# Patient Record
Sex: Male | Born: 1970 | Race: White | Hispanic: No | Marital: Single | Smoking: Current every day smoker
Health system: Southern US, Community
[De-identification: ages and names within clinical notes are randomized; demographics above are authoritative.]

## PROBLEM LIST (undated history)

## (undated) DIAGNOSIS — M549 Dorsalgia, unspecified: Secondary | ICD-10-CM

## (undated) DIAGNOSIS — J302 Other seasonal allergic rhinitis: Secondary | ICD-10-CM

## (undated) DIAGNOSIS — K4090 Unilateral inguinal hernia, without obstruction or gangrene, not specified as recurrent: Secondary | ICD-10-CM

---

## 2006-04-18 HISTORY — PX: HERNIA REPAIR: SHX51

## 2006-10-11 ENCOUNTER — Observation Stay (HOSPITAL_COMMUNITY): Admission: RE | Admit: 2006-10-11 | Discharge: 2006-10-11 | Payer: Self-pay | Admitting: General Surgery

## 2010-08-31 NOTE — Op Note (Signed)
NAME:  Dale Benjamin, Dale Benjamin NO.:  [id number]   MEDICAL RECORD NO.:  [id number]          PATIENT TYPE:  AMB   LOCATION:  DAY                           FACILITY:  [facility]   PHYSICIAN:  Barbaraann Barthel, M.D. DATE OF BIRTH:  1971-01-24   DATE OF PROCEDURE:  10/11/2006  DATE OF DISCHARGE:                               OPERATIVE REPORT   SURGEON:  Barbaraann Barthel, MD   PREOPERATIVE DIAGNOSIS:  Right inguinal hernia.   POSTOPERATIVE DIAGNOSIS:  Right inguinal hernia.   SPECIMEN:  None.   GROSS OPERATIVE FINDINGS:  Those consistent with a direct inguinal  hernia defect.   PROCEDURE:  Right inguinal herniorrhaphy (modified McVay repair).   NOTE:  This is a 40 year old white male who had a 3 or 50-month history  of a right groin mass.  He was referred by Dr. Wende Crease for a right  inguinal hernia.  We scheduled him for outpatient surgery.  We discussed  preoperatively complications not limited to but including bleeding,  infection and recurrence.  Informed consent was obtained.   TECHNIQUE:  The patient was placed in a supine position.  After the  adequate administration of general anesthesia via LMA anesthesia, his  abdomen and groin area were shaved and prepped with Betadine solution  and draped in the usual manner.  An incision was carried out between the  anterior superior iliac spine and the pubic tubercle through skin,  subcutaneous tissue and the Scarpa layer down to the external oblique.  The patient had very scanty amount of subcutaneous tissue.  The external  oblique was opened through the external ring.  The cord structures were  dissected free.  He had no indirect defect.  He had a direct defect.  This was repaired by suturing transversalis and transversus abdominis to  Cooper's ligament and Poupart's ligament with interrupted 2-0 Bralon  sutures.  A relaxing incision was carried out prior to cinching these  down tightly.  We then used approximately 10 mL of 0.5%  Sensorcaine to  help with postoperative comfort.  We returned the cord structures to  their anatomical position after irrigating with normal saline solution  and approximated the external oblique over the cord structures.  The  subcu was irrigated.  The skin  was approximated with stapling device.  A sterile OpSite dressing was  applied.  Prior to closure all sponge, needle and instrument counts were  found to be correct.  Estimated blood loss was minimal.  The patient  received 600 mL of crystalloids intraoperatively.  There were no  complications.      Barbaraann Barthel, M.D.  Electronically Signed     WB/MEDQ  D:  10/11/2006  T:  10/11/2006  Job:  [id number]   cc:   Laverle Hobby, M.D.  [address]  [place], [place] [address]

## 2011-02-02 LAB — CBC
MCHC: 33.5
MCV: 89.4
RBC: 4.97
RDW: 13.9

## 2011-02-02 LAB — DIFFERENTIAL
Basophils Absolute: 0
Basophils Relative: 0
Blasts: 0
Myelocytes: 0
Neutro Abs: 4.1
Neutrophils Relative %: 66
Promyelocytes Absolute: 0

## 2011-02-02 LAB — BASIC METABOLIC PANEL
BUN: 11
CO2: 26
Calcium: 9
Chloride: 105
Creatinine, Ser: 0.95
Glucose, Bld: 92

## 2015-08-25 ENCOUNTER — Encounter (HOSPITAL_COMMUNITY): Payer: Self-pay | Admitting: *Deleted

## 2015-08-25 ENCOUNTER — Emergency Department (HOSPITAL_COMMUNITY)
Admission: EM | Admit: 2015-08-25 | Discharge: 2015-08-25 | Disposition: A | Discharge status: Home / Self Care | Payer: Self-pay | Attending: Emergency Medicine | Admitting: Emergency Medicine

## 2015-08-25 DIAGNOSIS — Y929 Unspecified place or not applicable: Secondary | ICD-10-CM | POA: Insufficient documentation

## 2015-08-25 DIAGNOSIS — Y939 Activity, unspecified: Secondary | ICD-10-CM | POA: Insufficient documentation

## 2015-08-25 DIAGNOSIS — S0502XA Injury of conjunctiva and corneal abrasion without foreign body, left eye, initial encounter: Secondary | ICD-10-CM | POA: Insufficient documentation

## 2015-08-25 DIAGNOSIS — Y999 Unspecified external cause status: Secondary | ICD-10-CM | POA: Insufficient documentation

## 2015-08-25 DIAGNOSIS — F17210 Nicotine dependence, cigarettes, uncomplicated: Secondary | ICD-10-CM | POA: Insufficient documentation

## 2015-08-25 DIAGNOSIS — X58XXXA Exposure to other specified factors, initial encounter: Secondary | ICD-10-CM | POA: Insufficient documentation

## 2015-08-25 MED ORDER — KETOROLAC TROMETHAMINE 0.5 % OP SOLN
1.0000 [drp] | Freq: Once | OPHTHALMIC | Status: AC
  Administered 2015-08-25: 1 [drp] via OPHTHALMIC
  Filled 2015-08-25: qty 5

## 2015-08-25 MED ORDER — TETRACAINE HCL 0.5 % OP SOLN
2.0000 [drp] | Freq: Once | OPHTHALMIC | Status: DC
  Filled 2015-08-25: qty 4

## 2015-08-25 MED ORDER — FLUORESCEIN SODIUM 1 MG OP STRP: 1.0000 | ORAL_STRIP | Freq: Once | OPHTHALMIC | Status: DC

## 2015-08-25 MED ORDER — TOBRAMYCIN 0.3 % OP SOLN
1.0000 [drp] | Freq: Once | OPHTHALMIC | Status: AC
  Administered 2015-08-25: 1 [drp] via OPHTHALMIC
  Filled 2015-08-25: qty 5

## 2015-08-25 NOTE — Discharge Instructions (Signed)
Corneal Abrasion °The cornea is the clear covering at the front and center of the eye. When you look at the colored portion of the eye, you are looking through the cornea. It is a thin tissue made up of layers. The top layer is the most sensitive layer. A corneal abrasion happens if this layer is scratched or an injury causes it to come off.  °HOME CARE °· You may be given drops or a medicated cream. Use the medicine as told by your doctor. °· A pressure patch may be put over the eye. If this is done, follow your doctor's instructions for when to remove the patch. Do not drive or use machines while the eye patch is on. Judging distances is hard to do with a patch on. °· See your doctor for a follow-up exam if you are told to do so. It is very important that you keep this appointment. °GET HELP IF:  °· You have pain, are sensitive to light, and have a scratchy feeling in one eye or both eyes. °· Your pressure patch keeps getting loose. You can blink your eye under the patch. °· You have fluid coming from your eye or the lids stick together in the morning. °· You have the same symptoms in the morning that you did with the first abrasion. This could be days, weeks, or months after the first abrasion healed. °  °This information is not intended to replace advice given to you by your health care provider. Make sure you discuss any questions you have with your health care provider. °  °Document Released: 09/21/2007 Document Revised: 12/24/2014 Document Reviewed: 12/10/2012 °Elsevier Interactive Patient Education ©2016 [employer] ° °

## 2015-08-25 NOTE — ED Notes (Addendum)
Pt states he woke up with left eye drainage and redness. Pt left eye is notably red. Pt denies any injury. Denies n/v/d.

## 2015-08-27 NOTE — ED Provider Notes (Signed)
CSN: [id number]     Arrival date & time 08/25/15  1159 History   First MD Initiated Contact with Patient 08/25/15 1216     Chief Complaint  Patient presents with  . Eye Problem     (Consider location/radiation/quality/duration/timing/severity/associated sxs/prior Treatment) HPI   Dale Benjamin is a 45 y.o. male who presents to the Emergency Department complaining of left eye pain and redness.  Symptoms began on the morning of arrival.  He reports pain associated with blinking.  He also states he has excessive tearing, photophobia, and foreign body sensation.  He denies known trauma, but does admit to weed eating on the evening prior to arrival.  He denies dizziness, headache, facial pain or weakness and neck pain.  Also denies contact use.   History reviewed. No pertinent past medical history. Past Surgical History  Procedure Laterality Date  . Hernia repair     No family history on file. Social History  Substance Use Topics  . Smoking status: Current Every Day Smoker -- 0.50 packs/day    Types: Cigarettes  . Smokeless tobacco: None  . Alcohol Use: None     Comment: occ.    Review of Systems  Constitutional: Negative for fever and chills.  HENT: Negative for congestion.   Eyes: Positive for photophobia, pain, redness and visual disturbance.  Respiratory: Negative for cough and shortness of breath.   Gastrointestinal: Negative for nausea and vomiting.  Neurological: Negative for dizziness, syncope, weakness and headaches.  All other systems reviewed and are negative.     Allergies  Review of patient's allergies indicates no known allergies.  Home Medications   Prior to Admission medications   Not on File   BP 111/64 mmHg  Pulse 86  Temp(Src) 98.2 F (36.8 C) (Oral)  Resp 16  Ht 5\' 8"  (1.727 m)  Wt 58.968 kg  BMI 19.77 kg/m2  SpO2 100% Physical Exam  Constitutional: He is oriented to person, place, and time. He appears well-developed and well-nourished. No  distress.  HENT:  Head: Normocephalic and atraumatic.  Eyes: EOM are normal. Pupils are equal, round, and reactive to light. Lids are everted and swept, no foreign bodies found. Right conjunctiva is not injected. Left conjunctiva is injected.  Fundoscopic exam:      The left eye shows no papilledema.  Slit lamp exam:      The left eye shows corneal abrasion. The left eye shows no corneal flare, no corneal ulcer, no foreign body, no hyphema and no fluorescein uptake.  Three corneal abrasions to the left eye.  Slit lamp exam reveals negative Seidel's sign, no hyphema or FB.  Neck: Normal range of motion. Neck supple. No thyromegaly present.  Cardiovascular: Normal rate, regular rhythm and intact distal pulses.   Pulmonary/Chest: Effort normal and breath sounds normal. No respiratory distress.  Musculoskeletal: Normal range of motion.  Lymphadenopathy:    He has no cervical adenopathy.  Neurological: He is alert and oriented to person, place, and time. He exhibits normal muscle tone. Coordination normal.  Skin: Skin is warm and dry. No rash noted.  Nursing note and vitals reviewed.   ED Course  Procedures (including critical care time) Labs Review Labs Reviewed - No data to display  Imaging Review No results found. I have personally reviewed and evaluated these images and lab results as part of my medical decision-making.   EKG Interpretation None        Visual Acuity  Right Eye Distance: 20/70 Left Eye Distance:  (  unable to assess; patient states too blurry) Bilateral Distance:  (Patient states he wears glasses and does not have them with him.)    MDM   Final diagnoses:  Corneal abrasion, left, initial encounter    Pt with three corneal abrasions to the left eye.  No FB's seen.    Saline flush of left eye.  Pt stable for d/c and he agrees to close ophtho f/u.  Referral info given.  Tobramycin and ketorolac drops dispensed.      Pauline Aus, PA-C 08/27/15  2226  Donnetta Hutching, MD 09/14/15 1038

## 2016-02-15 ENCOUNTER — Emergency Department (HOSPITAL_COMMUNITY): Payer: Self-pay

## 2016-02-15 ENCOUNTER — Encounter (HOSPITAL_COMMUNITY): Payer: Self-pay | Admitting: Emergency Medicine

## 2016-02-15 ENCOUNTER — Emergency Department (HOSPITAL_COMMUNITY)
Admission: EM | Admit: 2016-02-15 | Discharge: 2016-02-15 | Disposition: A | Discharge status: Home / Self Care | Payer: Self-pay | Attending: Emergency Medicine | Admitting: Emergency Medicine

## 2016-02-15 DIAGNOSIS — F17210 Nicotine dependence, cigarettes, uncomplicated: Secondary | ICD-10-CM | POA: Insufficient documentation

## 2016-02-15 DIAGNOSIS — K4090 Unilateral inguinal hernia, without obstruction or gangrene, not specified as recurrent: Secondary | ICD-10-CM | POA: Insufficient documentation

## 2016-02-15 LAB — COMPREHENSIVE METABOLIC PANEL
ALT: 17 U/L (ref 17–63)
AST: 20 U/L (ref 15–41)
Albumin: 5 g/dL (ref 3.5–5.0)
Alkaline Phosphatase: 72 U/L (ref 38–126)
Anion gap: 8 (ref 5–15)
BUN: 10 mg/dL (ref 6–20)
CHLORIDE: 102 mmol/L (ref 101–111)
CO2: 27 mmol/L (ref 22–32)
CREATININE: 0.87 mg/dL (ref 0.61–1.24)
Calcium: 9.8 mg/dL (ref 8.9–10.3)
GFR calc non Af Amer: >60 mL/min (ref >60)
Glucose, Bld: 111 mg/dL — ABNORMAL HIGH (ref 65–99)
POTASSIUM: 3.6 mmol/L (ref 3.5–5.1)
SODIUM: 137 mmol/L (ref 135–145)
Total Bilirubin: 0.5 mg/dL (ref 0.3–1.2)
Total Protein: 8.1 g/dL (ref 6.5–8.1)

## 2016-02-15 LAB — CBC WITH DIFFERENTIAL/PLATELET
BASOS ABS: 0 10*3/uL (ref 0.0–0.1)
Basophils Relative: 0 %
EOS ABS: 0 10*3/uL (ref 0.0–0.7)
EOS PCT: 0 %
HCT: 48.8 % (ref 39.0–52.0)
Hemoglobin: 16.3 g/dL (ref 13.0–17.0)
LYMPHS ABS: 0.6 10*3/uL — AB (ref 0.7–4.0)
Lymphocytes Relative: 6 %
MCH: 31.7 pg (ref 26.0–34.0)
MCHC: 33.4 g/dL (ref 30.0–36.0)
MCV: 94.9 fL (ref 78.0–100.0)
Monocytes Absolute: 0.3 10*3/uL (ref 0.1–1.0)
Monocytes Relative: 3 %
Neutro Abs: 8.8 10*3/uL — ABNORMAL HIGH (ref 1.7–7.7)
Neutrophils Relative %: 91 %
PLATELETS: 223 10*3/uL (ref 150–400)
RBC: 5.14 MIL/uL (ref 4.22–5.81)
RDW: 13.1 % (ref 11.5–15.5)
WBC: 9.7 10*3/uL (ref 4.0–10.5)

## 2016-02-15 MED ORDER — IOPAMIDOL (ISOVUE-300) INJECTION 61%
100.0000 mL | Freq: Once | INTRAVENOUS | Status: AC | PRN
  Administered 2016-02-15: 100 mL via INTRAVENOUS

## 2016-02-15 MED ORDER — IOPAMIDOL (ISOVUE-300) INJECTION 61%
INTRAVENOUS | Status: AC
  Filled 2016-02-15: qty 30

## 2016-02-15 MED ORDER — HYDROCODONE-ACETAMINOPHEN 5-325 MG PO TABS: 1.0000 | ORAL_TABLET | Freq: Four times a day (QID) | ORAL | 0 refills | Status: DC | PRN

## 2016-02-15 MED ORDER — LORAZEPAM 2 MG/ML IJ SOLN
0.5000 mg | Freq: Once | INTRAMUSCULAR | Status: AC
  Administered 2016-02-15: 0.5 mg via INTRAVENOUS
  Filled 2016-02-15: qty 1

## 2016-02-15 MED ORDER — HYDROMORPHONE HCL 1 MG/ML IJ SOLN
1.0000 mg | Freq: Once | INTRAMUSCULAR | Status: AC
Start: 2016-02-15 — End: 2016-02-15
  Administered 2016-02-15: 1 mg via INTRAVENOUS
  Filled 2016-02-15 (×2): qty 1

## 2016-02-15 MED ORDER — ONDANSETRON HCL 4 MG/2ML IJ SOLN
4.0000 mg | Freq: Once | INTRAMUSCULAR | Status: AC
  Administered 2016-02-15: 4 mg via INTRAVENOUS
  Filled 2016-02-15: qty 2

## 2016-02-15 NOTE — Discharge Instructions (Signed)
Follow-up with Dr. Lovell SheehanJenkins and 1 week. Return if problems prior

## 2016-02-15 NOTE — ED Provider Notes (Signed)
[facility]-EMERGENCY DEPT Provider Note   CSN: [id number] Arrival date & time: 02/15/16  1220  By signing my name below, I, Clovis PuAvnee Patel, attest that this documentation has been prepared under the direction and in the presence of Bethann BerkshireJoseph Arhaan Chesnut, MD  Electronically Signed: Clovis PuAvnee Patel, ED Scribe. 02/15/16. 1:18 PM.   History   Chief Complaint Chief Complaint  Patient presents with  . Groin Pain    Patient has a history of a right inguinal hernia repair which has become more painful   The history is provided by the patient. No language interpreter was used.  Groin Pain  This is a recurrent problem. The current episode started 6 to 12 hours ago. The problem occurs constantly. The problem has been gradually worsening. Associated symptoms include abdominal pain. Pertinent negatives include no chest pain and no headaches. He has tried nothing for the symptoms.   HPI Comments:  Dale Benjamin is a 45 y.o. male, with a PSHx of hernia repair, who presents to the Emergency Department complaining of constant, cramping, sharp groin pain which began today. Pt notes he has had gradually worsening, intermittent groin pain for about 6 months. He states he has had the hernia for about 8 months-1 year. No alleviating factors noted. Pt denies any other symptoms or complaints at this time.  History reviewed. No pertinent past medical history.  There are no active problems to display for this patient.   Past Surgical History:  Procedure Laterality Date  . HERNIA REPAIR      Home Medications    Prior to Admission medications   Not on File    Family History History reviewed. No pertinent family history.  Social History Social History  Substance Use Topics  . Smoking status: Current Every Day Smoker    Packs/day: 0.50    Types: Cigarettes  . Smokeless tobacco: Never Used  . Alcohol use Yes     Comment: occ.     Allergies   Review of patient's allergies indicates no known  allergies.   Review of Systems Review of Systems  Constitutional: Negative for appetite change and fatigue.  HENT: Negative for congestion, ear discharge and sinus pressure.   Eyes: Negative for discharge.  Respiratory: Negative for cough.   Cardiovascular: Negative for chest pain.  Gastrointestinal: Positive for abdominal pain. Negative for diarrhea.  Genitourinary: Negative for frequency and hematuria.  Musculoskeletal: Negative for back pain.  Skin: Negative for rash.  Neurological: Negative for seizures and headaches.  Psychiatric/Behavioral: Negative for hallucinations.   Physical Exam Updated Vital Signs BP 141/86 (BP Location: Left Arm)   Pulse 74   Temp 97.7 F (36.5 C) (Oral)   Resp 20   Ht 5\' 8"  (1.727 m)   Wt 130 lb (59 kg)   SpO2 100%   BMI 19.77 kg/m   Physical Exam  Constitutional: He is oriented to person, place, and time. He appears well-developed.  HENT:  Head: Normocephalic.  Eyes: Conjunctivae and EOM are normal. No scleral icterus.  Neck: Neck supple. No thyromegaly present.  Cardiovascular: Normal rate and regular rhythm.  Exam reveals no gallop and no friction rub.   No murmur heard. Pulmonary/Chest: No stridor. He has no wheezes. He has no rales. He exhibits no tenderness.  Abdominal: He exhibits no distension. There is tenderness. There is no rebound. A hernia is present.  R inguinal hernia. 3 cm in diameter. Not reducible.   Musculoskeletal: Normal range of motion. He exhibits no edema.  Lymphadenopathy:  He has no cervical adenopathy.  Neurological: He is oriented to person, place, and time. He exhibits normal muscle tone. Coordination normal.  Skin: No rash noted. No erythema.  Psychiatric: He has a normal mood and affect. His behavior is normal.  Nursing note and vitals reviewed.  ED Treatments / Results  DIAGNOSTIC STUDIES:  Oxygen Saturation is 100% on RA, normal by my interpretation.    COORDINATION OF CARE:  1:10 PM Discussed  treatment plan with pt at bedside and pt agreed to plan.  Labs (all labs ordered are listed, but only abnormal results are displayed) Labs Reviewed  CBC WITH DIFFERENTIAL/PLATELET  BASIC METABOLIC PANEL  URINALYSIS, ROUTINE W REFLEX MICROSCOPIC (NOT [facility])    EKG  EKG Interpretation None       Radiology No results found.  Procedures Procedures (including critical care time)  Medications Ordered in ED Medications - No data to display   Initial Impression / Assessment and Plan / ED Course  I have reviewed the triage vital signs and the nursing notes.  Pertinent labs & imaging results that were available during my care of the patient were reviewed by me and considered in my medical decision making (see chart for details).  Clinical Course    CT scan shows right inguinal hernia with fat and fluid. No bowel and hernia. Patient will follow-up with general surgery  Final Clinical Impressions(s) / ED Diagnoses   Final diagnoses:  None    New Prescriptions New Prescriptions   No medications on file  The chart was scribed for me under my direct supervision.  I personally performed the history, physical, and medical decision making and all procedures in the evaluation of this patient.Bethann Berkshire.     Oma Alpert, MD 02/15/16 [phone]

## 2016-02-15 NOTE — ED Triage Notes (Signed)
Patient complaining of right groin pain radiating into umbilical area. States he had hernia surgery in area years ago.

## 2016-02-15 NOTE — ED Notes (Signed)
Pt taken to xray 

## 2016-04-13 ENCOUNTER — Encounter (HOSPITAL_COMMUNITY): Payer: Self-pay | Admitting: Emergency Medicine

## 2016-04-13 ENCOUNTER — Emergency Department (HOSPITAL_COMMUNITY): Payer: Self-pay

## 2016-04-13 ENCOUNTER — Inpatient Hospital Stay (HOSPITAL_COMMUNITY)
Admission: EM | Admit: 2016-04-13 | Discharge: 2016-04-14 | DRG: 395 | Disposition: A | Discharge status: Home / Self Care | Payer: Self-pay | Attending: General Surgery | Admitting: General Surgery

## 2016-04-13 DIAGNOSIS — K4090 Unilateral inguinal hernia, without obstruction or gangrene, not specified as recurrent: Secondary | ICD-10-CM

## 2016-04-13 DIAGNOSIS — J302 Other seasonal allergic rhinitis: Secondary | ICD-10-CM | POA: Diagnosis present

## 2016-04-13 DIAGNOSIS — K4031 Unilateral inguinal hernia, with obstruction, without gangrene, recurrent: Principal | ICD-10-CM | POA: Diagnosis present

## 2016-04-13 DIAGNOSIS — K56609 Unspecified intestinal obstruction, unspecified as to partial versus complete obstruction: Secondary | ICD-10-CM

## 2016-04-13 DIAGNOSIS — F17210 Nicotine dependence, cigarettes, uncomplicated: Secondary | ICD-10-CM | POA: Diagnosis present

## 2016-04-13 DIAGNOSIS — R112 Nausea with vomiting, unspecified: Secondary | ICD-10-CM

## 2016-04-13 HISTORY — DX: Other seasonal allergic rhinitis: J30.2

## 2016-04-13 LAB — COMPREHENSIVE METABOLIC PANEL
ALBUMIN: 4.7 g/dL (ref 3.5–5.0)
ALK PHOS: 67 U/L (ref 38–126)
ALT: 33 U/L (ref 17–63)
ANION GAP: 8 (ref 5–15)
AST: 38 U/L (ref 15–41)
BUN: 23 mg/dL — ABNORMAL HIGH (ref 6–20)
CALCIUM: 9.8 mg/dL (ref 8.9–10.3)
CHLORIDE: 97 mmol/L — AB (ref 101–111)
CO2: 31 mmol/L (ref 22–32)
Creatinine, Ser: 0.92 mg/dL (ref 0.61–1.24)
GFR calc non Af Amer: >60 mL/min (ref >60)
GLUCOSE: 116 mg/dL — AB (ref 65–99)
POTASSIUM: 4.6 mmol/L (ref 3.5–5.1)
SODIUM: 136 mmol/L (ref 135–145)
Total Bilirubin: 0.9 mg/dL (ref 0.3–1.2)
Total Protein: 7.9 g/dL (ref 6.5–8.1)

## 2016-04-13 LAB — CBC
HEMATOCRIT: 50.9 % (ref 39.0–52.0)
HEMOGLOBIN: 17 g/dL (ref 13.0–17.0)
MCH: 32.9 pg (ref 26.0–34.0)
MCHC: 33.4 g/dL (ref 30.0–36.0)
MCV: 98.5 fL (ref 78.0–100.0)
Platelets: 241 10*3/uL (ref 150–400)
RBC: 5.17 MIL/uL (ref 4.22–5.81)
RDW: 14.2 % (ref 11.5–15.5)
WBC: 14 10*3/uL — ABNORMAL HIGH (ref 4.0–10.5)

## 2016-04-13 LAB — LIPASE, BLOOD: LIPASE: 21 U/L (ref 11–51)

## 2016-04-13 LAB — URINALYSIS, ROUTINE W REFLEX MICROSCOPIC
Bilirubin Urine: NEGATIVE
Glucose, UA: NEGATIVE mg/dL
HGB URINE DIPSTICK: NEGATIVE
Ketones, ur: 15 mg/dL — AB
Leukocytes, UA: NEGATIVE
Nitrite: NEGATIVE
PH: 6 (ref 5.0–8.0)
Protein, ur: NEGATIVE mg/dL
SPECIFIC GRAVITY, URINE: 1.02 (ref 1.005–1.030)

## 2016-04-13 LAB — LACTIC ACID, PLASMA: Lactic Acid, Venous: 1.1 mmol/L (ref 0.5–1.9)

## 2016-04-13 LAB — MAGNESIUM: MAGNESIUM: 2.5 mg/dL — AB (ref 1.7–2.4)

## 2016-04-13 MED ORDER — ONDANSETRON HCL 4 MG/2ML IJ SOLN
4.0000 mg | INTRAMUSCULAR | Status: DC | PRN
  Administered 2016-04-13: 4 mg via INTRAVENOUS
  Filled 2016-04-13: qty 2

## 2016-04-13 MED ORDER — MORPHINE SULFATE (PF) 4 MG/ML IV SOLN
4.0000 mg | INTRAVENOUS | Status: AC | PRN
  Administered 2016-04-13 (×2): 4 mg via INTRAVENOUS
  Filled 2016-04-13 (×2): qty 1

## 2016-04-13 MED ORDER — IOPAMIDOL (ISOVUE-300) INJECTION 61%
100.0000 mL | Freq: Once | INTRAVENOUS | Status: AC | PRN
  Administered 2016-04-13: 100 mL via INTRAVENOUS

## 2016-04-13 MED ORDER — ONDANSETRON HCL 4 MG/2ML IJ SOLN
4.0000 mg | INTRAMUSCULAR | Status: DC | PRN
  Filled 2016-04-13: qty 2

## 2016-04-13 MED ORDER — SODIUM CHLORIDE 0.9 % IV SOLN
INTRAVENOUS | Status: DC
  Administered 2016-04-13: 23:00:00 via INTRAVENOUS

## 2016-04-13 MED ORDER — PROMETHAZINE HCL 25 MG/ML IJ SOLN: 12.5000 mg | Freq: Four times a day (QID) | INTRAMUSCULAR | Status: DC | PRN

## 2016-04-13 MED ORDER — MORPHINE SULFATE (PF) 4 MG/ML IV SOLN: 4.0000 mg | INTRAVENOUS | Status: DC | PRN

## 2016-04-13 MED ORDER — FAMOTIDINE IN NACL 20-0.9 MG/50ML-% IV SOLN
20.0000 mg | Freq: Once | INTRAVENOUS | Status: DC
  Filled 2016-04-13: qty 50

## 2016-04-13 MED ORDER — IOPAMIDOL (ISOVUE-300) INJECTION 61%
INTRAVENOUS | Status: AC
  Administered 2016-04-13: 30 mL
  Filled 2016-04-13: qty 30

## 2016-04-13 NOTE — H&P (Signed)
History and Physical    Dale Benjamin ZOX:[id number] DOB: April 08, 1971 DOA: 04/13/2016  PCP: No PCP Per Patient   Patient coming from: Home.  Chief Complaint: Abdominal pain.  HPI: Dale Benjamin is a 45 y.o. male with medical history significant of seasonal allergies and inguinal right hernia repair about 18 years ago this coming with complaints of generalized abdominal pain, nausea and emesis multiple times for the past 2 days. He denies fever, chills, chest pain, dyspnea, diarrhea, constipation melena, hematochezia or GU symptoms. He mentions that his hernia "has been sticking out" for the past year.  ED Course: The patient received famotidine, normal saline, morphine 4, promethazine 12.5 and Zofran 4 mg IVP which she states produced relief. His workup showed WBC of 14, hemoglobin 17 g/dL, platelets 811. His sodium was 136, potassium 4.6, chloride 97 and CO2 31 mmol/L. BUN 23, creatinine 0.92 and glucose 160 mg/dL.  Imaging: CT scan of abdomen/pelvis showed small bowel obstruction secondary to right inguinal hernia. Mesenteric edema and free fluid, but no perforation.  Review of Systems: As per HPI otherwise 10 point review of systems negative.    Past Medical History:  Diagnosis Date  . Seasonal allergies     Past Surgical History:  Procedure Laterality Date  . HERNIA REPAIR Right 2008     reports that he has been smoking Cigarettes.  He has been smoking about 0.50 packs per day. He has never used smokeless tobacco. He reports that he drinks alcohol. He reports that he does not use drugs.  No Known Allergies  Family History  Problem Relation Age of Onset  . Allergies Mother     Prior to Admission medications   Medication Sig Start Date End Date Taking? Authorizing Provider  HYDROcodone-acetaminophen (NORCO/VICODIN) 5-325 MG tablet Take 1 tablet by mouth every 6 (six) hours as needed. 02/15/16   Bethann Berkshire, MD    Physical Exam:  Constitutional: NAD, calm,  comfortable Vitals:   04/13/16 1750 04/13/16 1751 04/13/16 2038 04/13/16 2309  BP: 132/90  123/89 131/74  Pulse: 65  87 75  Resp: 16  16 17   Temp: 99.4 F (37.4 C)     TempSrc: Oral     SpO2: 99%  100% 100%  Weight:  59 kg (130 lb)    Height:  5\' 8"  (1.727 m)     Eyes: PERRL, lids and conjunctivae normal ENMT: Mucous membranes are mildly dry. Posterior pharynx clear of any exudate or lesions. Neck: normal, supple, no masses, no thyromegaly Respiratory: clear to auscultation bilaterally, no wheezing, no crackles. Normal respiratory effort. No accessory muscle use.  Cardiovascular: Regular rate and rhythm, no murmurs / rubs / gallops. No extremity edema. 2+ pedal pulses. No carotid bruits.  Abdomen: Soft, mild diffuse tenderness, no guarding/rebound/masses palpated. No hepatosplenomegaly. Bowel sounds positive. Visible, non-reducible right inguinal hernia. Musculoskeletal: no clubbing / cyanosis. No joint deformity upper and lower extremities. Good ROM, no contractures. Normal muscle tone.  Skin: No rashes, lesions, ulcers. Neurologic: CN 2-12 grossly intact. Sensation intact, DTR normal. Strength 5/5 in all 4.  Psychiatric: Normal judgment and insight. Alert and oriented x 3. Normal mood.    Labs on Admission: I have personally reviewed following labs and imaging studies  CBC:  Recent Labs Lab 04/13/16 1908  WBC 14.0*  HGB 17.0  HCT 50.9  MCV 98.5  PLT 241   Basic Metabolic Panel:  Recent Labs Lab 04/13/16 1908 04/13/16 2247  NA 136  --   K 4.6  --  CL 97*  --   CO2 31  --   GLUCOSE 116*  --   BUN 23*  --   CREATININE 0.92  --   CALCIUM 9.8  --   MG  --  2.5*   GFR: Estimated Creatinine Clearance: 86.4 mL/min (by C-G formula based on SCr of 0.92 mg/dL). Liver Function Tests:  Recent Labs Lab 04/13/16 1908  AST 38  ALT 33  ALKPHOS 67  BILITOT 0.9  PROT 7.9  ALBUMIN 4.7    Recent Labs Lab 04/13/16 1908  LIPASE 21   No results for input(s):  AMMONIA in the last 168 hours. Coagulation Profile: No results for input(s): INR, PROTIME in the last 168 hours. Cardiac Enzymes: No results for input(s): CKTOTAL, CKMB, CKMBINDEX, TROPONINI in the last 168 hours. BNP (last 3 results) No results for input(s): PROBNP in the last 8760 hours. HbA1C: No results for input(s): HGBA1C in the last 72 hours. CBG: No results for input(s): GLUCAP in the last 168 hours. Lipid Profile: No results for input(s): CHOL, HDL, LDLCALC, TRIG, CHOLHDL, LDLDIRECT in the last 72 hours. Thyroid Function Tests: No results for input(s): TSH, T4TOTAL, FREET4, T3FREE, THYROIDAB in the last 72 hours. Anemia Panel: No results for input(s): VITAMINB12, FOLATE, FERRITIN, TIBC, IRON, RETICCTPCT in the last 72 hours. Urine analysis:    Component Value Date/Time   COLORURINE YELLOW 04/13/2016 1754   APPEARANCEUR CLEAR 04/13/2016 1754   LABSPEC 1.020 04/13/2016 1754   PHURINE 6.0 04/13/2016 1754   GLUCOSEU NEGATIVE 04/13/2016 1754   HGBUR NEGATIVE 04/13/2016 1754   BILIRUBINUR NEGATIVE 04/13/2016 1754   KETONESUR 15 (A) 04/13/2016 1754   PROTEINUR NEGATIVE 04/13/2016 1754   NITRITE NEGATIVE 04/13/2016 1754   LEUKOCYTESUR NEGATIVE 04/13/2016 1754    Radiological Exams on Admission: Ct Abdomen Pelvis W Contrast  Result Date: 04/13/2016 CLINICAL DATA:  Mid and lower abdominal pain for 2 days. Vomiting. Nausea. EXAM: CT ABDOMEN AND PELVIS WITH CONTRAST TECHNIQUE: Multidetector CT imaging of the abdomen and pelvis was performed using the standard protocol following bolus administration of intravenous contrast. CONTRAST:  100mL ISOVUE-300 IOPAMIDOL (ISOVUE-300) INJECTION 61% COMPARISON:  CT 02/15/2016 FINDINGS: Lower chest: Allowing for motion artifact, the lung bases are clear. Hepatobiliary: No focal liver abnormality is seen. No gallstones, gallbladder wall thickening, or biliary dilatation. Pancreas: Unremarkable. No pancreatic ductal dilatation or surrounding  inflammatory changes. Spleen: Normal in size without focal abnormality. Adrenals/Urinary Tract: Normal adrenal glands. No hydronephrosis or perinephric edema. Extrarenal pelvis configuration on the right. Small cyst in the lower left kidney is unchanged. Urinary bladder is physiologically distended without wall thickening. Stomach/Bowel: Stomach distended with ingested contrast. Diffuse fluid-filled small bowel dilatation. Small bowel dilated to transition point within a right inguinal hernia. The exiting and distal most small bowel loops are decompressed. There is mesenteric edema and free fluid but no pneumatosis. No perforation. The colon is relatively decompressed. Portions of the appendix tentatively identified without inflammation. Vascular/Lymphatic: No significant vascular findings are present. No enlarged abdominal or pelvic lymph nodes. Reproductive: Prostate is unremarkable. Other: Mesenteric edema and small volume of free fluid. No evidence of free air. There is fat and trace fluid in the left inguinal canal. Musculoskeletal: There are no acute or suspicious osseous abnormalities. IMPRESSION: Small bowel obstruction secondary to right inguinal hernia. Mesenteric edema and free fluid but no evidence of perforation. Electronically Signed   By: Rubye OaksMelanie  Ehinger M.D.   On: 04/13/2016 22:03    EKG: Independently reviewed. Pending.  Assessment/Plan Principal Problem:  SBO (small bowel obstruction) Admit to MedSurg/inpatient. Continue IV hydration. Continue antiemetics as needed. Continue analgesics as needed. Famotidine 20 mg IVP 1 dose.  Active Problems:   Right inguinal hernia General surgery to evaluate.    Seasonal allergies Asymptomatic now.   DVT prophylaxis: Lovenox SQ. Code Status: Full code. Family Communication:  Disposition Plan: Admit for IV hydration, symptoms treatment and bowel rest. Consults called: Franky MachoMark Jenkins, M.D. (Gen. surgery). Admission status:  Inpatient/MedSurg.   Bobette Moavid Manuel Ortiz MD [facility] Pager [phone].  If 7PM-7AM, please contact night-coverage [email or web address] Password [email or web address]  04/13/2016, 11:38 PM

## 2016-04-13 NOTE — ED Provider Notes (Signed)
[facility]Y DEPT Provider Note   CSN: [id number] Arrival date & time: 04/13/16  1731     History   Chief Complaint Chief Complaint  Patient presents with  . Abdominal Pain    HPI Dale Benjamin is a 45 y.o. male.  HPI  Pt was seen at 1950.   Per pt, c/o gradual onset and persistence of constant generalized abd "pain" for the past 2 days.  Has been associated with multiple intermittent episodes of N/V.  Describes the abd pain as "aching" and located in his periumbilical area.  Denies diarrhea, no fevers, no back pain, no rash, no CP/SOB, no black or blood in stools or emesis.      History reviewed. No pertinent past medical history.  There are no active problems to display for this patient.   Past Surgical History:  Procedure Laterality Date  . HERNIA REPAIR Right 2008       Home Medications    Prior to Admission medications   Medication Sig Start Date End Date Taking? Authorizing Provider  HYDROcodone-acetaminophen (NORCO/VICODIN) 5-325 MG tablet Take 1 tablet by mouth every 6 (six) hours as needed. 02/15/16   Bethann BerkshireJoseph Zammit, MD    Family History No family history on file.  Social History Social History  Substance Use Topics  . Smoking status: Current Every Day Smoker    Packs/day: 0.50    Types: Cigarettes  . Smokeless tobacco: Never Used  . Alcohol use Yes     Comment: occ.     Allergies   Patient has no known allergies.   Review of Systems Review of Systems ROS: Statement: All systems negative except as marked or noted in the HPI; Constitutional: Negative for fever and chills. ; ; Eyes: Negative for eye pain, redness and discharge. ; ; ENMT: Negative for ear pain, hoarseness, nasal congestion, sinus pressure and sore throat. ; ; Cardiovascular: Negative for chest pain, palpitations, diaphoresis, dyspnea and peripheral edema. ; ; Respiratory: Negative for cough, wheezing and stridor. ; ; Gastrointestinal: +N/V, abd pain. Negative for diarrhea,  blood in stool, hematemesis, jaundice and rectal bleeding. . ; ; Genitourinary: Negative for dysuria, flank pain and hematuria. ; ; Genital:  No penile drainage or rash, no testicular pain or swelling, no scrotal rash or swelling. ;; Musculoskeletal: Negative for back pain and neck pain. Negative for swelling and trauma.; ; Skin: Negative for pruritus, rash, abrasions, blisters, bruising and skin lesion.; ; Neuro: Negative for headache, lightheadedness and neck stiffness. Negative for weakness, altered level of consciousness, altered mental status, extremity weakness, paresthesias, involuntary movement, seizure and syncope.       Physical Exam Updated Vital Signs BP 123/89 (BP Location: Right Arm)   Pulse 87   Temp 99.4 F (37.4 C) (Oral)   Resp 16   Ht 5\' 8"  (1.727 m)   Wt 130 lb (59 kg)   SpO2 100%   BMI 19.77 kg/m   Physical Exam 1955: Physical examination:  Nursing notes reviewed; Vital signs and O2 SAT reviewed;  Constitutional: Well developed, Well nourished, Well hydrated, In no acute distress; Head:  Normocephalic, atraumatic; Eyes: EOMI, PERRL, No scleral icterus; ENMT: Mouth and pharynx normal, Mucous membranes moist; Neck: Supple, Full range of motion, No lymphadenopathy; Cardiovascular: Regular rate and rhythm, No gallop; Respiratory: Breath sounds clear & equal bilaterally, No wheezes.  Speaking full sentences with ease, Normal respiratory effort/excursion; Chest: Nontender, Movement normal; Abdomen: Soft, +diffuse tenderness to palp. Nondistended, Normal bowel sounds; Genitourinary: No CVA tenderness; Extremities: Pulses  normal, No tenderness, No edema, No calf edema or asymmetry.; Neuro: AA&Ox3, Major CN grossly intact.  Speech clear. No gross focal motor or sensory deficits in extremities.; Skin: Color normal, Warm, Dry.   ED Treatments / Results  Labs (all labs ordered are listed, but only abnormal results are displayed)   EKG  EKG Interpretation None        Radiology   Procedures Procedures (including critical care time)  Medications Ordered in ED Medications  ondansetron (ZOFRAN) injection 4 mg (4 mg Intravenous Given 04/13/16 2012)  morphine 4 MG/ML injection 4 mg (4 mg Intravenous Given 04/13/16 2011)  iopamidol (ISOVUE-300) 61 % injection (30 mLs  Contrast Given 04/13/16 2125)  iopamidol (ISOVUE-300) 61 % injection 100 mL (100 mLs Intravenous Contrast Given 04/13/16 2126)     Initial Impression / Assessment and Plan / ED Course  I have reviewed the triage vital signs and the nursing notes.  Pertinent labs & imaging results that were available during my care of the patient were reviewed by me and considered in my medical decision making (see chart for details).  MDM Reviewed: previous chart, nursing note and vitals Reviewed previous: labs Interpretation: labs and CT scan    Results for orders placed or performed during the hospital encounter of 04/13/16  Lipase, blood  Result Value Ref Range   Lipase 21 11 - 51 U/L  Comprehensive metabolic panel  Result Value Ref Range   Sodium 136 135 - 145 mmol/L   Potassium 4.6 3.5 - 5.1 mmol/L   Chloride 97 (L) 101 - 111 mmol/L   CO2 31 22 - 32 mmol/L   Glucose, Bld 116 (H) 65 - 99 mg/dL   BUN 23 (H) 6 - 20 mg/dL   Creatinine, Ser 1.61 0.61 - 1.24 mg/dL   Calcium 9.8 8.9 - 09.6 mg/dL   Total Protein 7.9 6.5 - 8.1 g/dL   Albumin 4.7 3.5 - 5.0 g/dL   AST 38 15 - 41 U/L   ALT 33 17 - 63 U/L   Alkaline Phosphatase 67 38 - 126 U/L   Total Bilirubin 0.9 0.3 - 1.2 mg/dL   GFR calc non Af Amer >60 >60 mL/min   GFR calc Af Amer >60 >60 mL/min   Anion gap 8 5 - 15  CBC  Result Value Ref Range   WBC 14.0 (H) 4.0 - 10.5 K/uL   RBC 5.17 4.22 - 5.81 MIL/uL   Hemoglobin 17.0 13.0 - 17.0 g/dL   HCT 04.5 40.9 - 81.1 %   MCV 98.5 78.0 - 100.0 fL   MCH 32.9 26.0 - 34.0 pg   MCHC 33.4 30.0 - 36.0 g/dL   RDW 91.4 78.2 - 95.6 %   Platelets 241 150 - 400 K/uL  Urinalysis, Routine w  reflex microscopic  Result Value Ref Range   Color, Urine YELLOW YELLOW   APPearance CLEAR CLEAR   Specific Gravity, Urine 1.020 1.005 - 1.030   pH 6.0 5.0 - 8.0   Glucose, UA NEGATIVE NEGATIVE mg/dL   Hgb urine dipstick NEGATIVE NEGATIVE   Bilirubin Urine NEGATIVE NEGATIVE   Ketones, ur 15 (A) NEGATIVE mg/dL   Protein, ur NEGATIVE NEGATIVE mg/dL   Nitrite NEGATIVE NEGATIVE   Leukocytes, UA NEGATIVE NEGATIVE   Ct Abdomen Pelvis W Contrast Result Date: 04/13/2016 CLINICAL DATA:  Mid and lower abdominal pain for 2 days. Vomiting. Nausea. EXAM: CT ABDOMEN AND PELVIS WITH CONTRAST TECHNIQUE: Multidetector CT imaging of the abdomen and pelvis was performed using  the standard protocol following bolus administration of intravenous contrast. CONTRAST:  100mL ISOVUE-300 IOPAMIDOL (ISOVUE-300) INJECTION 61% COMPARISON:  CT 02/15/2016 FINDINGS: Lower chest: Allowing for motion artifact, the lung bases are clear. Hepatobiliary: No focal liver abnormality is seen. No gallstones, gallbladder wall thickening, or biliary dilatation. Pancreas: Unremarkable. No pancreatic ductal dilatation or surrounding inflammatory changes. Spleen: Normal in size without focal abnormality. Adrenals/Urinary Tract: Normal adrenal glands. No hydronephrosis or perinephric edema. Extrarenal pelvis configuration on the right. Small cyst in the lower left kidney is unchanged. Urinary bladder is physiologically distended without wall thickening. Stomach/Bowel: Stomach distended with ingested contrast. Diffuse fluid-filled small bowel dilatation. Small bowel dilated to transition point within a right inguinal hernia. The exiting and distal most small bowel loops are decompressed. There is mesenteric edema and free fluid but no pneumatosis. No perforation. The colon is relatively decompressed. Portions of the appendix tentatively identified without inflammation. Vascular/Lymphatic: No significant vascular findings are present. No enlarged  abdominal or pelvic lymph nodes. Reproductive: Prostate is unremarkable. Other: Mesenteric edema and small volume of free fluid. No evidence of free air. There is fat and trace fluid in the left inguinal canal. Musculoskeletal: There are no acute or suspicious osseous abnormalities. IMPRESSION: Small bowel obstruction secondary to right inguinal hernia. Mesenteric edema and free fluid but no evidence of perforation. Electronically Signed   By: Rubye OaksMelanie  Ehinger M.D.   On: 04/13/2016 22:03    2215:  Pt re-evaluated: Pt continues to c/o periumbilical abd pain, nausea. +Right inguinal hernia just medial to previous hernia repair surgical scar, no overlying erythema or ecchymosis. Unable to reduce. Pt c/o periumbilical abd pain during attempted reduction. Pt states "that's been sticking out like that for the past year." Denies any change in the hernia area over the past 2 days. Denies specific hernia pain over the past 2 days. Pt also endorses several months ago he "tried once to put it in and I couldn't."  Pt was evaluated in the ED 2 months ago for right inguinal "pain" with CT scan revealing inguinal hernia; also not able to be reduced at that time. T/C to General Surgeon Dr. Lovell SheehanJenkins, case discussed, including:  HPI, pertinent PM/SHx, VS/PE, dx testing, ED course and treatment:  Requests to admit to [facility], pain/nausea control, IVF, NPO, bedrest with BRP, he will be in early in morning for evaluation.  T/C to [facility] Dr. Robb Matarrtiz, case discussed, including:  HPI, pertinent PM/SHx, VS/PE, dx testing, ED course and treatment:  Agreeable to admit, requests he will place orders.      Final Clinical Impressions(s) / ED Diagnoses   Final diagnoses:  None    New Prescriptions New Prescriptions   No medications on file     Samuel JesterKathleen Twylla Arceneaux, DO 04/15/16 1847

## 2016-04-13 NOTE — ED Triage Notes (Signed)
Patient complains of abdominal pain with nausea and vomiting.

## 2016-04-14 ENCOUNTER — Encounter (HOSPITAL_COMMUNITY): Payer: Self-pay | Admitting: *Deleted

## 2016-04-14 ENCOUNTER — Inpatient Hospital Stay (HOSPITAL_COMMUNITY): Payer: Self-pay

## 2016-04-14 LAB — COMPREHENSIVE METABOLIC PANEL
ALK PHOS: 57 U/L (ref 38–126)
ALT: 29 U/L (ref 17–63)
AST: 27 U/L (ref 15–41)
Albumin: 4.1 g/dL (ref 3.5–5.0)
Anion gap: 8 (ref 5–15)
BUN: 22 mg/dL — AB (ref 6–20)
CALCIUM: 9.1 mg/dL (ref 8.9–10.3)
CO2: 30 mmol/L (ref 22–32)
CREATININE: 0.86 mg/dL (ref 0.61–1.24)
Chloride: 98 mmol/L — ABNORMAL LOW (ref 101–111)
GFR calc non Af Amer: >60 mL/min (ref >60)
GLUCOSE: 110 mg/dL — AB (ref 65–99)
Potassium: 4.1 mmol/L (ref 3.5–5.1)
SODIUM: 136 mmol/L (ref 135–145)
Total Bilirubin: 0.8 mg/dL (ref 0.3–1.2)
Total Protein: 6.8 g/dL (ref 6.5–8.1)

## 2016-04-14 LAB — CBC WITH DIFFERENTIAL/PLATELET
Basophils Absolute: 0 10*3/uL (ref 0.0–0.1)
Basophils Relative: 0 %
EOS ABS: 0.1 10*3/uL (ref 0.0–0.7)
Eosinophils Relative: 0 %
HCT: 45.6 % (ref 39.0–52.0)
HEMOGLOBIN: 15.1 g/dL (ref 13.0–17.0)
LYMPHS ABS: 1.1 10*3/uL (ref 0.7–4.0)
LYMPHS PCT: 8 %
MCH: 32.4 pg (ref 26.0–34.0)
MCHC: 33.1 g/dL (ref 30.0–36.0)
MCV: 97.9 fL (ref 78.0–100.0)
Monocytes Absolute: 0.9 10*3/uL (ref 0.1–1.0)
Monocytes Relative: 7 %
NEUTROS PCT: 85 %
Neutro Abs: 11.4 10*3/uL — ABNORMAL HIGH (ref 1.7–7.7)
Platelets: 205 10*3/uL (ref 150–400)
RBC: 4.66 MIL/uL (ref 4.22–5.81)
RDW: 14.4 % (ref 11.5–15.5)
WBC: 13.5 10*3/uL — AB (ref 4.0–10.5)

## 2016-04-14 LAB — LACTIC ACID, PLASMA: Lactic Acid, Venous: 1 mmol/L (ref 0.5–1.9)

## 2016-04-14 MED ORDER — SODIUM CHLORIDE 0.9 % IV BOLUS (SEPSIS)
1000.0000 mL | Freq: Once | INTRAVENOUS | Status: AC
  Administered 2016-04-14: 1000 mL via INTRAVENOUS

## 2016-04-14 MED ORDER — FAMOTIDINE IN NACL 20-0.9 MG/50ML-% IV SOLN
20.0000 mg | Freq: Two times a day (BID) | INTRAVENOUS | Status: DC
  Administered 2016-04-14 (×2): 20 mg via INTRAVENOUS
  Filled 2016-04-14: qty 50

## 2016-04-14 MED ORDER — ONDANSETRON HCL 4 MG/2ML IJ SOLN: 4.0000 mg | Freq: Four times a day (QID) | INTRAMUSCULAR | Status: DC | PRN

## 2016-04-14 MED ORDER — HEPARIN SODIUM (PORCINE) 5000 UNIT/ML IJ SOLN: 5000.0000 [IU] | Freq: Three times a day (TID) | INTRAMUSCULAR | Status: DC

## 2016-04-14 MED ORDER — MORPHINE SULFATE (PF) 4 MG/ML IV SOLN
4.0000 mg | INTRAVENOUS | Status: DC | PRN
  Administered 2016-04-14 (×3): 4 mg via INTRAVENOUS
  Filled 2016-04-14 (×3): qty 1

## 2016-04-14 MED ORDER — HYDROCODONE-ACETAMINOPHEN 5-325 MG PO TABS: 1.0000 | ORAL_TABLET | ORAL | 0 refills | Status: DC | PRN

## 2016-04-14 MED ORDER — SODIUM CHLORIDE 0.9 % IV SOLN
INTRAVENOUS | Status: DC
  Administered 2016-04-14: 04:00:00 via INTRAVENOUS

## 2016-04-14 NOTE — Discharge Instructions (Signed)
Inguinal Hernia, Adult Introduction An inguinal hernia is when fat or the intestines push through the area where the leg meets the lower belly (groin) and make a rounded lump (bulge). This condition happens over time. There are three types of inguinal hernias. These types include:  Hernias that can be pushed back into the belly (are reducible).  Hernias that cannot be pushed back into the belly (are incarcerated).  Hernias that cannot be pushed back into the belly and lose their blood supply (get strangulated). This type needs emergency surgery. Follow these instructions at home: Lifestyle  Drink enough fluid to keep your urine (pee) clear or pale yellow.  Eat plenty of fruits, vegetables, and whole grains. These have a lot of fiber. Talk with your doctor if you have questions.  Avoid lifting heavy objects.  Avoid standing for long periods of time.  Do not use tobacco products. These include cigarettes, chewing tobacco, or e-cigarettes. If you need help quitting, ask your doctor.  Try to stay at a healthy weight. General instructions  Do not try to force the hernia back in.  Watch your hernia for any changes in color or size. Let your doctor know if there are any changes.  Take over-the-counter and prescription medicines only as told by your doctor.  Keep all follow-up visits as told by your doctor. This is important. Contact a doctor if:  You have a fever.  You have new symptoms.  Your symptoms get worse. Get help right away if:  The area where the legs meets the lower belly has:  Pain that gets worse suddenly.  A bulge that gets bigger suddenly and does not go down.  A bulge that turns red or purple.  A bulge that is painful to the touch.  You are a man and your scrotum:  Suddenly feels painful.  Suddenly changes in size.  You feel sick to your stomach (nauseous) and this feeling does not go away.  You throw up (vomit) and this keeps happening.  You feel  your heart beating a lot more quickly than normal.  You cannot poop (have a bowel movement) or pass gas. This information is not intended to replace advice given to you by your health care provider. Make sure you discuss any questions you have with your health care provider. Document Released: 05/05/2006 Document Revised: 09/10/2015 Document Reviewed: 02/12/2014  2017 Elsevier  

## 2016-04-14 NOTE — Discharge Summary (Signed)
Physician Discharge Summary  Patient ID: Dale Benjamin MRN: [id number] DOB/AGE: Dec 07, 1970 45 y.o.  Admit date: 04/13/2016 Discharge date: 04/14/2016  Admission Diagnoses:Incarcerated right inguinal hernia, recurrent Small bowel obstruction  Discharge Diagnoses: Same, incarceration of right inguinal hernia resolved, small bowel obstruction resolved Principal Problem:   SBO (small bowel obstruction) Active Problems:   Right inguinal hernia   Discharged Condition: good  Hospital Course: Patient is a 45 year old white male with a known recurrent right inguinal hernia who was sent to the emergency room with a 2 day history of worsening nausea and vomiting. CT scan of the abdomen revealed a small bowel obstruction secondary to incarcerated right inguinal hernia. It was partially reduced in the emergency room. The patient was admitted to the hospital for bowel rest. The following morning, the patient states that his nausea and vomiting have resolved. I was able to fully reduce the bowel within the right inguinal hernia. He wishes to be discharged and have his surgery done as an outpatient, which is fine with me. The signs and symptoms of incarceration were fully explained to the patient. He was instructed to return to the emergency room should the symptoms return.  Discharge Exam: Blood pressure 131/77, pulse 66, temperature 98.6 F (37 C), temperature source Oral, resp. rate 16, height 5\' 8"  (1.727 m), weight 50.7 kg (111 lb 12.8 oz), SpO2 99 %. General appearance: alert, cooperative and no distress Resp: clear to auscultation bilaterally Cardio: regular rate and rhythm, S1, S2 normal, no murmur, click, rub or gallop GI: soft, non-tender; bowel sounds normal; no masses,  no organomegaly and Reducible left inguinal hernia, obvious bulge in the right inguinal region beneath the surgical scar, but no bowel present in hernia sac.  Disposition: 01-Home or Self Care   Allergies as of  04/14/2016   No Known Allergies     Medication List    TAKE these medications   HYDROcodone-acetaminophen 5-325 MG tablet Commonly known as:  NORCO/VICODIN Take 1-2 tablets by mouth every 4 (four) hours as needed. What changed:  how much to take  when to take this      Follow-up Information    Dalia HeadingJENKINS,Blaike Newburn A, MD. Call.   Specialty:  General Surgery Why:  Call office to be seen by Dr. Lovell SheehanJenkins to schedule surgery Contact information: [address] [place] [id number] [phone]           Signed: Franky MachoJENKINS,Kinzlee Selvy A 04/14/2016, 8:24 AM

## 2016-04-14 NOTE — Consult Note (Signed)
Reason for Consult: Right inguinal hernia, recurrent Referring Physician: Dr. Clarisa Fling is an 45 y.o. male.  HPI: Patient is a 45 year old white male with a known history of a recurrent right inguinal hernia, last seen in the emergency room in October 2017 presented emergency room with a 2 day history of worsening periumbilical pain, nausea, and vomiting. He denied any fever or chills. In the emergency room, he was noted to have a right inguinal hernia that could now be fully reduced. CT scan the abdomen revealed a small bowel obstruction secondary to the right inguinal hernia. As he was having no pain in the right groin region, he was admitted to the hospital for bowel rest. This morning, he states his nausea and vomiting have resolved. He has not had an episode of emesis since his admission. His umbilical pain has resolved. He states that the right inguinal hernia bulge is similar to what it's been for the past year. He has received pain medication, but minimally. He is hungry. His pain is 2 out of 10. He is status post right inguinal herniorrhaphy by another physician in the remote past.  Past Medical History:  Diagnosis Date  . Seasonal allergies     Past Surgical History:  Procedure Laterality Date  . HERNIA REPAIR Right 2008    Family History  Problem Relation Age of Onset  . Allergies Mother     Social History:  reports that he has been smoking Cigarettes.  He has been smoking about 0.50 packs per day. He has never used smokeless tobacco. He reports that he drinks alcohol. He reports that he does not use drugs.  Allergies: No Known Allergies  Medications:  Prior to Admission:  Prescriptions Prior to Admission  Medication Sig Dispense Refill Last Dose  . HYDROcodone-acetaminophen (NORCO/VICODIN) 5-325 MG tablet Take 1 tablet by mouth every 6 (six) hours as needed. 20 tablet 0    Scheduled: . famotidine (PEPCID) IV  20 mg Intravenous Q12H  . famotidine (PEPCID)  IV  20 mg Intravenous Once  . heparin  5,000 Units Subcutaneous Q8H    Results for orders placed or performed during the hospital encounter of 04/13/16 (from the past 48 hour(s))  Urinalysis, Routine w reflex microscopic     Status: Abnormal   Collection Time: 04/13/16  5:54 PM  Result Value Ref Range   Color, Urine YELLOW YELLOW   APPearance CLEAR CLEAR   Specific Gravity, Urine 1.020 1.005 - 1.030   pH 6.0 5.0 - 8.0   Glucose, UA NEGATIVE NEGATIVE mg/dL   Hgb urine dipstick NEGATIVE NEGATIVE   Bilirubin Urine NEGATIVE NEGATIVE   Ketones, ur 15 (A) NEGATIVE mg/dL   Protein, ur NEGATIVE NEGATIVE mg/dL   Nitrite NEGATIVE NEGATIVE   Leukocytes, UA NEGATIVE NEGATIVE    Comment: Microscopic not done on urines with negative protein, blood, leukocytes, nitrite, or glucose < 500 mg/dL.  Lipase, blood     Status: None   Collection Time: 04/13/16  7:08 PM  Result Value Ref Range   Lipase 21 11 - 51 U/L  Comprehensive metabolic panel     Status: Abnormal   Collection Time: 04/13/16  7:08 PM  Result Value Ref Range   Sodium 136 135 - 145 mmol/L   Potassium 4.6 3.5 - 5.1 mmol/L   Chloride 97 (L) 101 - 111 mmol/L   CO2 31 22 - 32 mmol/L   Glucose, Bld 116 (H) 65 - 99 mg/dL   BUN 23 (H) 6 -  20 mg/dL   Creatinine, Ser 0.92 0.61 - 1.24 mg/dL   Calcium 9.8 8.9 - 10.3 mg/dL   Total Protein 7.9 6.5 - 8.1 g/dL   Albumin 4.7 3.5 - 5.0 g/dL   AST 38 15 - 41 U/L   ALT 33 17 - 63 U/L   Alkaline Phosphatase 67 38 - 126 U/L   Total Bilirubin 0.9 0.3 - 1.2 mg/dL   GFR calc non Af Amer >60 >60 mL/min   GFR calc Af Amer >60 >60 mL/min    Comment: (NOTE) The eGFR has been calculated using the CKD EPI equation. This calculation has not been validated in all clinical situations. eGFR's persistently <60 mL/min signify possible Chronic Kidney Disease.    Anion gap 8 5 - 15  CBC     Status: Abnormal   Collection Time: 04/13/16  7:08 PM  Result Value Ref Range   WBC 14.0 (H) 4.0 - 10.5 K/uL   RBC  5.17 4.22 - 5.81 MIL/uL   Hemoglobin 17.0 13.0 - 17.0 g/dL   HCT 50.9 39.0 - 52.0 %   MCV 98.5 78.0 - 100.0 fL   MCH 32.9 26.0 - 34.0 pg   MCHC 33.4 30.0 - 36.0 g/dL   RDW 14.2 11.5 - 15.5 %   Platelets 241 150 - 400 K/uL  Lactic acid, plasma     Status: None   Collection Time: 04/13/16 10:46 PM  Result Value Ref Range   Lactic Acid, Venous 1.1 0.5 - 1.9 mmol/L  Magnesium     Status: Abnormal   Collection Time: 04/13/16 10:47 PM  Result Value Ref Range   Magnesium 2.5 (H) 1.7 - 2.4 mg/dL  Lactic acid, plasma     Status: None   Collection Time: 04/14/16  2:00 AM  Result Value Ref Range   Lactic Acid, Venous 1.0 0.5 - 1.9 mmol/L  CBC WITH DIFFERENTIAL     Status: Abnormal   Collection Time: 04/14/16  2:00 AM  Result Value Ref Range   WBC 13.5 (H) 4.0 - 10.5 K/uL   RBC 4.66 4.22 - 5.81 MIL/uL   Hemoglobin 15.1 13.0 - 17.0 g/dL   HCT 45.6 39.0 - 52.0 %   MCV 97.9 78.0 - 100.0 fL   MCH 32.4 26.0 - 34.0 pg   MCHC 33.1 30.0 - 36.0 g/dL   RDW 14.4 11.5 - 15.5 %   Platelets 205 150 - 400 K/uL   Neutrophils Relative % 85 %   Neutro Abs 11.4 (H) 1.7 - 7.7 K/uL   Lymphocytes Relative 8 %   Lymphs Abs 1.1 0.7 - 4.0 K/uL   Monocytes Relative 7 %   Monocytes Absolute 0.9 0.1 - 1.0 K/uL   Eosinophils Relative 0 %   Eosinophils Absolute 0.1 0.0 - 0.7 K/uL   Basophils Relative 0 %   Basophils Absolute 0.0 0.0 - 0.1 K/uL  Comprehensive metabolic panel     Status: Abnormal   Collection Time: 04/14/16  2:00 AM  Result Value Ref Range   Sodium 136 135 - 145 mmol/L   Potassium 4.1 3.5 - 5.1 mmol/L   Chloride 98 (L) 101 - 111 mmol/L   CO2 30 22 - 32 mmol/L   Glucose, Bld 110 (H) 65 - 99 mg/dL   BUN 22 (H) 6 - 20 mg/dL   Creatinine, Ser 0.86 0.61 - 1.24 mg/dL   Calcium 9.1 8.9 - 10.3 mg/dL   Total Protein 6.8 6.5 - 8.1 g/dL   Albumin 4.1 3.5 -  5.0 g/dL   AST 27 15 - 41 U/L   ALT 29 17 - 63 U/L   Alkaline Phosphatase 57 38 - 126 U/L   Total Bilirubin 0.8 0.3 - 1.2 mg/dL   GFR calc  non Af Amer >60 >60 mL/min   GFR calc Af Amer >60 >60 mL/min    Comment: (NOTE) The eGFR has been calculated using the CKD EPI equation. This calculation has not been validated in all clinical situations. eGFR's persistently <60 mL/min signify possible Chronic Kidney Disease.    Anion gap 8 5 - 15    Ct Abdomen Pelvis W Contrast  Result Date: 04/13/2016 CLINICAL DATA:  Mid and lower abdominal pain for 2 days. Vomiting. Nausea. EXAM: CT ABDOMEN AND PELVIS WITH CONTRAST TECHNIQUE: Multidetector CT imaging of the abdomen and pelvis was performed using the standard protocol following bolus administration of intravenous contrast. CONTRAST:  143m ISOVUE-300 IOPAMIDOL (ISOVUE-300) INJECTION 61% COMPARISON:  CT 02/15/2016 FINDINGS: Lower chest: Allowing for motion artifact, the lung bases are clear. Hepatobiliary: No focal liver abnormality is seen. No gallstones, gallbladder wall thickening, or biliary dilatation. Pancreas: Unremarkable. No pancreatic ductal dilatation or surrounding inflammatory changes. Spleen: Normal in size without focal abnormality. Adrenals/Urinary Tract: Normal adrenal glands. No hydronephrosis or perinephric edema. Extrarenal pelvis configuration on the right. Small cyst in the lower left kidney is unchanged. Urinary bladder is physiologically distended without wall thickening. Stomach/Bowel: Stomach distended with ingested contrast. Diffuse fluid-filled small bowel dilatation. Small bowel dilated to transition point within a right inguinal hernia. The exiting and distal most small bowel loops are decompressed. There is mesenteric edema and free fluid but no pneumatosis. No perforation. The colon is relatively decompressed. Portions of the appendix tentatively identified without inflammation. Vascular/Lymphatic: No significant vascular findings are present. No enlarged abdominal or pelvic lymph nodes. Reproductive: Prostate is unremarkable. Other: Mesenteric edema and small volume of  free fluid. No evidence of free air. There is fat and trace fluid in the left inguinal canal. Musculoskeletal: There are no acute or suspicious osseous abnormalities. IMPRESSION: Small bowel obstruction secondary to right inguinal hernia. Mesenteric edema and free fluid but no evidence of perforation. Electronically Signed   By: MJeb LeveringM.D.   On: 04/13/2016 22:03    ROS:  Pertinent items noted in HPI and remainder of comprehensive ROS otherwise negative.  Blood pressure 131/77, pulse 66, temperature 98.6 F (37 C), temperature source Oral, resp. rate 16, height '5\' 8"'$  (1.727 m), weight 50.7 kg (111 lb 12.8 oz), SpO2 99 %. Physical Exam: Pleasant well-developed well-nourished white male in no acute distress. Head is normocephalic, atraumatic Lungs are clear auscultation with equal breath sounds bilaterally Heart examination reveals a regular rate and rhythm without S3, S4, murmurs. Abdomen is soft and flat with bowel sounds present. No rigidity is noted. No hepatosplenomegaly or masses noted. He has a reducible left inguinal hernia. On the right, I was able to reduce the bowel in the right inguinal hernia. He states the bulge that has been present is normal. He is not having tenderness in the right groin region. I auscultated the right inguinal bulge and no bowel sounds were appreciated.  Assessment/Plan: Impression: Right inguinal hernia with small bowel obstruction. The obstruction has resolved. The bowel has been reduced. I did offer surgical repair during this admission, but the patient states he would like to go home and do this as an elective procedure. I did tell him that he does need this repaired given his episode of incarceration.  He fully understands this. The signs and symptoms of incarceration were explained to the patient. He was instructed to return to the emergency room should this develop again. He will call my office to schedule an outpatient right inguinal hernia  repair.  Taiyana Kissler A 04/14/2016, 8:17 AM

## 2016-04-14 NOTE — ACP (Advance Care Planning) (Signed)
Patient was already discharged when I brought Advance Directive information to his room.

## 2016-04-15 ENCOUNTER — Observation Stay (HOSPITAL_COMMUNITY): Payer: Self-pay

## 2016-04-15 ENCOUNTER — Observation Stay (HOSPITAL_COMMUNITY): Payer: Self-pay | Admitting: Anesthesiology

## 2016-04-15 ENCOUNTER — Encounter (HOSPITAL_COMMUNITY)
Admission: EM | Disposition: A | Discharge status: Home / Self Care | Payer: Self-pay | Source: Home / Self Care | Attending: Emergency Medicine

## 2016-04-15 ENCOUNTER — Encounter (HOSPITAL_COMMUNITY): Payer: Self-pay | Admitting: Emergency Medicine

## 2016-04-15 ENCOUNTER — Observation Stay (HOSPITAL_COMMUNITY)
Admission: EM | Admit: 2016-04-15 | Discharge: 2016-04-16 | Disposition: A | Discharge status: Home / Self Care | Payer: Self-pay | Attending: General Surgery | Admitting: General Surgery

## 2016-04-15 DIAGNOSIS — K4090 Unilateral inguinal hernia, without obstruction or gangrene, not specified as recurrent: Principal | ICD-10-CM

## 2016-04-15 DIAGNOSIS — F17210 Nicotine dependence, cigarettes, uncomplicated: Secondary | ICD-10-CM | POA: Insufficient documentation

## 2016-04-15 DIAGNOSIS — K4031 Unilateral inguinal hernia, with obstruction, without gangrene, recurrent: Secondary | ICD-10-CM | POA: Diagnosis present

## 2016-04-15 HISTORY — PX: INGUINAL HERNIA REPAIR: SHX194

## 2016-04-15 SURGERY — REPAIR, HERNIA, INGUINAL, INCARCERATED
Anesthesia: General | Laterality: Right

## 2016-04-15 MED ORDER — KETOROLAC TROMETHAMINE 30 MG/ML IJ SOLN: 30.0000 mg | Freq: Once | INTRAMUSCULAR | Status: DC

## 2016-04-15 MED ORDER — CHLORHEXIDINE GLUCONATE CLOTH 2 % EX PADS: 6.0000 | MEDICATED_PAD | Freq: Once | CUTANEOUS | Status: DC

## 2016-04-15 MED ORDER — HYDROMORPHONE HCL 1 MG/ML IJ SOLN
1.0000 mg | INTRAMUSCULAR | Status: DC | PRN
  Administered 2016-04-15: 1 mg via INTRAVENOUS
  Filled 2016-04-15: qty 1

## 2016-04-15 MED ORDER — SUCCINYLCHOLINE CHLORIDE 20 MG/ML IJ SOLN
INTRAMUSCULAR | Status: DC | PRN
  Administered 2016-04-15: 140 mg via INTRAVENOUS

## 2016-04-15 MED ORDER — FENTANYL CITRATE (PF) 100 MCG/2ML IJ SOLN
INTRAMUSCULAR | Status: DC | PRN
  Administered 2016-04-15: 100 ug via INTRAVENOUS
  Administered 2016-04-15 (×3): 50 ug via INTRAVENOUS

## 2016-04-15 MED ORDER — CEFAZOLIN SODIUM-DEXTROSE 2-4 GM/100ML-% IV SOLN
INTRAVENOUS | Status: AC
  Filled 2016-04-15: qty 100

## 2016-04-15 MED ORDER — POVIDONE-IODINE 10 % OINT PACKET
TOPICAL_OINTMENT | CUTANEOUS | Status: DC | PRN
  Administered 2016-04-15: 1 via TOPICAL

## 2016-04-15 MED ORDER — GLYCOPYRROLATE 0.2 MG/ML IJ SOLN
INTRAMUSCULAR | Status: AC
  Filled 2016-04-15: qty 3

## 2016-04-15 MED ORDER — BUPIVACAINE LIPOSOME 1.3 % IJ SUSP
INTRAMUSCULAR | Status: AC
  Filled 2016-04-15: qty 20

## 2016-04-15 MED ORDER — OXYCODONE-ACETAMINOPHEN 5-325 MG PO TABS
1.0000 | ORAL_TABLET | ORAL | Status: DC | PRN
  Administered 2016-04-15 – 2016-04-16 (×2): 2 via ORAL
  Filled 2016-04-15 (×2): qty 2

## 2016-04-15 MED ORDER — ROCURONIUM BROMIDE 100 MG/10ML IV SOLN
INTRAVENOUS | Status: DC | PRN
  Administered 2016-04-15: 15 mg via INTRAVENOUS
  Administered 2016-04-15: 5 mg via INTRAVENOUS

## 2016-04-15 MED ORDER — MIDAZOLAM HCL 2 MG/2ML IJ SOLN
INTRAMUSCULAR | Status: AC
  Filled 2016-04-15: qty 2

## 2016-04-15 MED ORDER — SIMETHICONE 80 MG PO CHEW: 40.0000 mg | CHEWABLE_TABLET | Freq: Four times a day (QID) | ORAL | Status: DC | PRN

## 2016-04-15 MED ORDER — ONDANSETRON 4 MG PO TBDP: 4.0000 mg | ORAL_TABLET | Freq: Four times a day (QID) | ORAL | Status: DC | PRN

## 2016-04-15 MED ORDER — LACTATED RINGERS IV SOLN
INTRAVENOUS | Status: DC
  Administered 2016-04-15: 13:00:00 via INTRAVENOUS
  Administered 2016-04-15: 1000 mL via INTRAVENOUS

## 2016-04-15 MED ORDER — ARTIFICIAL TEARS OP OINT
TOPICAL_OINTMENT | OPHTHALMIC | Status: DC | PRN
  Administered 2016-04-15: 1 via OPHTHALMIC

## 2016-04-15 MED ORDER — BUPIVACAINE LIPOSOME 1.3 % IJ SUSP
INTRAMUSCULAR | Status: DC | PRN
  Administered 2016-04-15: 20 mL

## 2016-04-15 MED ORDER — FENTANYL CITRATE (PF) 250 MCG/5ML IJ SOLN
INTRAMUSCULAR | Status: AC
  Filled 2016-04-15: qty 5

## 2016-04-15 MED ORDER — GLYCOPYRROLATE 0.2 MG/ML IJ SOLN
INTRAMUSCULAR | Status: AC
  Filled 2016-04-15: qty 2

## 2016-04-15 MED ORDER — SUCCINYLCHOLINE CHLORIDE 20 MG/ML IJ SOLN
INTRAMUSCULAR | Status: AC
  Filled 2016-04-15: qty 1

## 2016-04-15 MED ORDER — MIDAZOLAM HCL 2 MG/2ML IJ SOLN
1.0000 mg | INTRAMUSCULAR | Status: DC | PRN
  Administered 2016-04-15: 2 mg via INTRAVENOUS

## 2016-04-15 MED ORDER — PROPOFOL 10 MG/ML IV BOLUS
INTRAVENOUS | Status: AC
  Filled 2016-04-15: qty 20

## 2016-04-15 MED ORDER — ONDANSETRON HCL 4 MG/2ML IJ SOLN: 4.0000 mg | Freq: Four times a day (QID) | INTRAMUSCULAR | Status: DC | PRN

## 2016-04-15 MED ORDER — SODIUM CHLORIDE 0.9 % IV SOLN
INTRAVENOUS | Status: DC
  Administered 2016-04-15 – 2016-04-16 (×2): via INTRAVENOUS

## 2016-04-15 MED ORDER — CEFAZOLIN SODIUM-DEXTROSE 2-4 GM/100ML-% IV SOLN
2.0000 g | Freq: Three times a day (TID) | INTRAVENOUS | Status: DC
  Administered 2016-04-15 – 2016-04-16 (×2): 2 g via INTRAVENOUS
  Filled 2016-04-15 (×12): qty 100

## 2016-04-15 MED ORDER — ENOXAPARIN SODIUM 40 MG/0.4ML ~~LOC~~ SOLN
40.0000 mg | SUBCUTANEOUS | Status: DC
  Filled 2016-04-15: qty 0.4

## 2016-04-15 MED ORDER — ACETAMINOPHEN 650 MG RE SUPP: 650.0000 mg | Freq: Four times a day (QID) | RECTAL | Status: DC | PRN

## 2016-04-15 MED ORDER — ACETAMINOPHEN 325 MG PO TABS
650.0000 mg | ORAL_TABLET | Freq: Four times a day (QID) | ORAL | Status: DC | PRN
  Administered 2016-04-16: 650 mg via ORAL
  Filled 2016-04-15: qty 2

## 2016-04-15 MED ORDER — 0.9 % SODIUM CHLORIDE (POUR BTL) OPTIME
TOPICAL | Status: DC | PRN
  Administered 2016-04-15: 1000 mL

## 2016-04-15 MED ORDER — HYDROMORPHONE HCL 1 MG/ML IJ SOLN: 0.2500 mg | INTRAMUSCULAR | Status: DC | PRN

## 2016-04-15 MED ORDER — POVIDONE-IODINE 10 % EX OINT
TOPICAL_OINTMENT | CUTANEOUS | Status: AC
  Filled 2016-04-15: qty 1

## 2016-04-15 MED ORDER — ONDANSETRON HCL 4 MG/2ML IJ SOLN
4.0000 mg | Freq: Once | INTRAMUSCULAR | Status: AC
  Administered 2016-04-15: 4 mg via INTRAVENOUS

## 2016-04-15 MED ORDER — DIPHENHYDRAMINE HCL 50 MG/ML IJ SOLN: 25.0000 mg | Freq: Four times a day (QID) | INTRAMUSCULAR | Status: DC | PRN

## 2016-04-15 MED ORDER — ONDANSETRON HCL 4 MG/2ML IJ SOLN
INTRAMUSCULAR | Status: AC
  Filled 2016-04-15: qty 2

## 2016-04-15 MED ORDER — NEOSTIGMINE METHYLSULFATE 10 MG/10ML IV SOLN
INTRAVENOUS | Status: DC | PRN
  Administered 2016-04-15: 4 mg via INTRAVENOUS

## 2016-04-15 MED ORDER — PROPOFOL 10 MG/ML IV BOLUS
INTRAVENOUS | Status: DC | PRN
  Administered 2016-04-15: 150 mg via INTRAVENOUS
  Administered 2016-04-15: 25 mg via INTRAVENOUS

## 2016-04-15 MED ORDER — LORAZEPAM 2 MG/ML IJ SOLN: 1.0000 mg | INTRAMUSCULAR | Status: DC | PRN

## 2016-04-15 MED ORDER — DIPHENHYDRAMINE HCL 25 MG PO CAPS: 25.0000 mg | ORAL_CAPSULE | Freq: Four times a day (QID) | ORAL | Status: DC | PRN

## 2016-04-15 MED ORDER — CEFAZOLIN SODIUM-DEXTROSE 2-4 GM/100ML-% IV SOLN
2.0000 g | INTRAVENOUS | Status: AC
  Administered 2016-04-15: 2 g via INTRAVENOUS

## 2016-04-15 MED ORDER — ARTIFICIAL TEARS OP OINT
TOPICAL_OINTMENT | OPHTHALMIC | Status: AC
  Filled 2016-04-15: qty 3.5

## 2016-04-15 MED ORDER — GLYCOPYRROLATE 0.2 MG/ML IJ SOLN
INTRAMUSCULAR | Status: DC | PRN
Start: 2016-04-15 — End: 2016-04-15
  Administered 2016-04-15: 0.6 mg via INTRAVENOUS

## 2016-04-15 SURGICAL SUPPLY — 36 items
BAG HAMPER (MISCELLANEOUS) ×2 IMPLANT
BLADE SURG 15 STRL LF DISP TIS (BLADE) IMPLANT
BLADE SURG 15 STRL SS (BLADE) ×3
CLOTH BEACON ORANGE TIMEOUT ST (SAFETY) ×3 IMPLANT
COVER LIGHT HANDLE STERIS (MISCELLANEOUS) ×3 IMPLANT
DRAIN PENROSE 18X1/2 LTX STRL (DRAIN) ×2 IMPLANT
ELECT REM PT RETURN 9FT ADLT (ELECTROSURGICAL) ×3
ELECTRODE REM PT RTRN 9FT ADLT (ELECTROSURGICAL) IMPLANT
GAUZE SPONGE 4X4 12PLY STRL (GAUZE/BANDAGES/DRESSINGS) ×2 IMPLANT
GLOVE BIOGEL PI IND STRL 7.0 (GLOVE) IMPLANT
GLOVE BIOGEL PI INDICATOR 7.0 (GLOVE) ×2
GLOVE ECLIPSE 6.5 STRL STRAW (GLOVE) ×2 IMPLANT
GLOVE EXAM NITRILE MD LF STRL (GLOVE) ×3 IMPLANT
GLOVE SURG SS PI 7.5 STRL IVOR (GLOVE) ×3 IMPLANT
GOWN STRL REUS W/TWL LRG LVL3 (GOWN DISPOSABLE) ×4 IMPLANT
HANDLE SUCTION POOLE (INSTRUMENTS) IMPLANT
INST SET MAJOR GENERAL (KITS) ×3 IMPLANT
KIT ROOM TURNOVER APOR (KITS) ×2 IMPLANT
MANIFOLD NEPTUNE II (INSTRUMENTS) ×3 IMPLANT
MESH VENTRALEX ST 1-7/10 CRC S (Mesh General) ×3 IMPLANT
NDL HYPO 21X1.5 SAFETY (NEEDLE) IMPLANT
NEEDLE HYPO 21X1.5 SAFETY (NEEDLE) ×3 IMPLANT
NS IRRIG 1000ML POUR BTL (IV SOLUTION) ×2 IMPLANT
PACK ABDOMINAL MAJOR (CUSTOM PROCEDURE TRAY) ×2 IMPLANT
PAD ARMBOARD 7.5X6 YLW CONV (MISCELLANEOUS) ×3 IMPLANT
SET BASIN LINEN APH (SET/KITS/TRAYS/PACK) ×2 IMPLANT
SUCTION POOLE HANDLE (INSTRUMENTS) ×3
SUT NOVA NAB GS-22 2 2-0 T-19 (SUTURE) ×4 IMPLANT
SUT VIC AB 2-0 CT1 27 (SUTURE) ×3
SUT VIC AB 2-0 CT1 TAPERPNT 27 (SUTURE) IMPLANT
SUT VIC AB 3-0 SH 27 (SUTURE) ×3
SUT VIC AB 3-0 SH 27X BRD (SUTURE) IMPLANT
SUT VIC AB 4-0 PS2 27 (SUTURE) ×2 IMPLANT
SYR 20CC LL (SYRINGE) ×2 IMPLANT
TAPE CLOTH SURG 4X10 WHT LF (GAUZE/BANDAGES/DRESSINGS) ×3 IMPLANT
YANKAUER SUCT BULB TIP 10FT TU (MISCELLANEOUS) ×2 IMPLANT

## 2016-04-15 NOTE — ED Triage Notes (Signed)
Pt returns after having hernia reduced, feels like it has popped by out, having worse pain and vomiting

## 2016-04-15 NOTE — Anesthesia Preprocedure Evaluation (Signed)
Anesthesia Evaluation  Patient identified by MRN, date of birth, ID band Patient awake    Reviewed: Allergy & Precautions, NPO status , Patient's Chart, lab work & pertinent test results  Airway        Dental   Pulmonary Current Smoker,           Cardiovascular      Neuro/Psych    GI/Hepatic   Endo/Other    Renal/GU      Musculoskeletal   Abdominal   Peds  Hematology   Anesthesia Other Findings Incarcerated hernia with bowel obstruction.  Reproductive/Obstetrics                             Anesthesia Physical Anesthesia Plan  ASA: I and emergent  Anesthesia Plan: General   Post-op Pain Management:    Induction: Intravenous, Rapid sequence and Cricoid pressure planned  Airway Management Planned: Oral ETT  Additional Equipment:   Intra-op Plan:   Post-operative Plan: Extubation in OR  Informed Consent: I have reviewed the patients History and Physical, chart, labs and discussed the procedure including the risks, benefits and alternatives for the proposed anesthesia with the patient or authorized representative who has indicated his/her understanding and acceptance.     Plan Discussed with:   Anesthesia Plan Comments:         Anesthesia Quick Evaluation

## 2016-04-15 NOTE — Transfer of Care (Signed)
Immediate Anesthesia Transfer of Care Note  Patient: Dale Benjamin  Procedure(s) Performed: Procedure(s): HERNIA REPAIR INGUINAL INCARCERATED (Right)  Patient Location: PACU  Anesthesia Type:General  Level of Consciousness: awake, alert , oriented and patient cooperative  Airway & Oxygen Therapy: Patient Spontanous Breathing and Patient connected to face mask oxygen  Post-op Assessment: Report given to RN and Post -op Vital signs reviewed and stable  Post vital signs: Reviewed and stable  Last Vitals:  Vitals:   04/15/16 1145 04/15/16 1200  BP: 129/89 132/89  Pulse:    Resp: 12 20  Temp: 36.7 C     Last Pain:  Vitals:   04/15/16 1014  TempSrc: Oral  PainSc:          Complications: No apparent anesthesia complications

## 2016-04-15 NOTE — ED Provider Notes (Signed)
[facility]-EMERGENCY DEPT Provider Note   CSN: [id number] Arrival date & time: 04/15/16  1005   By signing my name below, I, Clarisse GougeXavier Herndon, attest that this documentation has been prepared under the direction and in the presence of Donnetta HutchingBrian Shara Hartis, MD. Electronically signed, Clarisse GougeXavier Herndon, ED Scribe. 04/15/16. 11:01 AM.   History   Chief Complaint Chief Complaint  Patient presents with  . Hernia   The history is provided by the patient and medical records. No language interpreter was used.    HPI Comments: Dale Benjamin is a 45 y.o. male who presents to the Emergency Department complaining of a progressively worsening right inguinal hernia over the past 6 months. He states a Careers advisersurgeon reduced the hernia 12/28 in [facility], but he states it did not go all the way down, and it has "popped out" today. Pt notes he cannot eat or drink fluids secondary to nausea/vomiting. He states he was last seen by Dr. Lovell SheehanJenkins in the [facility], and he was discharged yesterday.  Past Medical History:  Diagnosis Date  . Seasonal allergies     Patient Active Problem List   Diagnosis Date Noted  . SBO (small bowel obstruction) 04/13/2016  . Right inguinal hernia 04/13/2016    Past Surgical History:  Procedure Laterality Date  . HERNIA REPAIR Right 2008       Home Medications    Prior to Admission medications   Medication Sig Start Date End Date Taking? Authorizing Provider  HYDROcodone-acetaminophen (NORCO/VICODIN) 5-325 MG tablet Take 1-2 tablets by mouth every 4 (four) hours as needed. 04/14/16   Franky MachoMark Jenkins, MD    Family History Family History  Problem Relation Age of Onset  . Allergies Mother     Social History Social History  Substance Use Topics  . Smoking status: Current Every Day Smoker    Packs/day: 0.50    Types: Cigarettes  . Smokeless tobacco: Never Used  . Alcohol use Yes     Comment: occ.     Allergies   Patient has no known allergies.   Review of Systems Review of  Systems  All other systems reviewed and are negative.  A complete 10 system review of systems was obtained and all systems are negative except as noted in the HPI and PMH.    Physical Exam Updated Vital Signs BP 143/95 (BP Location: Left Arm)   Pulse 74   Temp 98.2 F (36.8 C) (Oral)   Resp 20   Ht 5\' 8"  (1.727 m)   Wt 111 lb (50.3 kg)   SpO2 99%   BMI 16.88 kg/m   Physical Exam  Constitutional: He is oriented to person, place, and time. He appears well-developed and well-nourished.  HENT:  Head: Normocephalic and atraumatic.  Eyes: Conjunctivae are normal.  Neck: Neck supple.  Cardiovascular: Normal rate and regular rhythm.   Pulmonary/Chest: Effort normal and breath sounds normal.  Abdominal: A hernia is present.  ~3-4 cm right inguinal hernia.  Musculoskeletal: Normal range of motion.  Neurological: He is alert and oriented to person, place, and time.  Skin: Skin is warm and dry.  Psychiatric: He has a normal mood and affect. His behavior is normal.  Nursing note and vitals reviewed.    ED Treatments / Results  DIAGNOSTIC STUDIES: Oxygen Saturation is 99% on RA, normal by my interpretation.    COORDINATION OF CARE: 11:01 AM Discussed treatment plan with pt at bedside and pt agreed to plan.  Labs (all labs ordered are listed, but  only abnormal results are displayed) Labs Reviewed - No data to display  EKG  EKG Interpretation None       Radiology Ct Abdomen Pelvis W Contrast  Result Date: 04/13/2016 CLINICAL DATA:  Mid and lower abdominal pain for 2 days. Vomiting. Nausea. EXAM: CT ABDOMEN AND PELVIS WITH CONTRAST TECHNIQUE: Multidetector CT imaging of the abdomen and pelvis was performed using the standard protocol following bolus administration of intravenous contrast. CONTRAST:  100mL ISOVUE-300 IOPAMIDOL (ISOVUE-300) INJECTION 61% COMPARISON:  CT 02/15/2016 FINDINGS: Lower chest: Allowing for motion artifact, the lung bases are clear. Hepatobiliary: No  focal liver abnormality is seen. No gallstones, gallbladder wall thickening, or biliary dilatation. Pancreas: Unremarkable. No pancreatic ductal dilatation or surrounding inflammatory changes. Spleen: Normal in size without focal abnormality. Adrenals/Urinary Tract: Normal adrenal glands. No hydronephrosis or perinephric edema. Extrarenal pelvis configuration on the right. Small cyst in the lower left kidney is unchanged. Urinary bladder is physiologically distended without wall thickening. Stomach/Bowel: Stomach distended with ingested contrast. Diffuse fluid-filled small bowel dilatation. Small bowel dilated to transition point within a right inguinal hernia. The exiting and distal most small bowel loops are decompressed. There is mesenteric edema and free fluid but no pneumatosis. No perforation. The colon is relatively decompressed. Portions of the appendix tentatively identified without inflammation. Vascular/Lymphatic: No significant vascular findings are present. No enlarged abdominal or pelvic lymph nodes. Reproductive: Prostate is unremarkable. Other: Mesenteric edema and small volume of free fluid. No evidence of free air. There is fat and trace fluid in the left inguinal canal. Musculoskeletal: There are no acute or suspicious osseous abnormalities. IMPRESSION: Small bowel obstruction secondary to right inguinal hernia. Mesenteric edema and free fluid but no evidence of perforation. Electronically Signed   By: Rubye OaksMelanie  Ehinger M.D.   On: 04/13/2016 22:03   Dg Abd 2 Views  Result Date: 04/14/2016 CLINICAL DATA:  Small bowel obstruction EXAM: ABDOMEN - 2 VIEW COMPARISON:  CT abdomen and pelvis April 13, 2016 FINDINGS: Supine and upright images obtained. There are multiple loops of dilated small bowel multiple air-fluid levels and pattern consistent with bowel obstruction. No free air evident. Lung bases clear. There is contrast in the urinary bladder. IMPRESSION: Bowel gas pattern consistent with  small bowel obstruction. Electronically Signed   By: Bretta BangWilliam  Woodruff III M.D.   On: 04/14/2016 08:55    Procedures Procedures (including critical care time)  Medications Ordered in ED Medications - No data to display   Initial Impression / Assessment and Plan / ED Course  I have reviewed the triage vital signs and the nursing notes.  Pertinent labs & imaging results that were available during my care of the patient were reviewed by me and considered in my medical decision making (see chart for details).  Will recheck labs, offer pain management, order a CT scan and order a general surgery consult.  Clinical Course     Will consult general surgery [Dr Loraine LericheMark Jenkins] for right inguinal hernia.  Probable admission for surgery  Final Clinical Impressions(s) / ED Diagnoses   Final diagnoses:  Right inguinal hernia    New Prescriptions New Prescriptions   No medications on file  I personally performed the services described in this documentation, which was scribed in my presence. The recorded information has been reviewed and is accurate.     Donnetta HutchingBrian Elbert Polyakov, MD 04/15/16 [phone]

## 2016-04-15 NOTE — Anesthesia Procedure Notes (Signed)
Procedure Name: Intubation Date/Time: 04/15/2016 12:27 PM Performed by: Pernell DupreADAMS, AMY A Pre-anesthesia Checklist: Patient identified, Patient being monitored, Timeout performed, Emergency Drugs available and Suction available Patient Re-evaluated:Patient Re-evaluated prior to inductionOxygen Delivery Method: Circle System Utilized Preoxygenation: Pre-oxygenation with 100% oxygen Intubation Type: IV induction, Rapid sequence and Cricoid Pressure applied Laryngoscope Size: Miller and 3 Grade View: Grade I Tube type: Oral Tube size: 7.0 mm Number of attempts: 1 Airway Equipment and Method: Stylet Placement Confirmation: ETT inserted through vocal cords under direct vision,  positive ETCO2 and breath sounds checked- equal and bilateral Secured at: 21 cm Tube secured with: Tape Dental Injury: Teeth and Oropharynx as per pre-operative assessment

## 2016-04-15 NOTE — Anesthesia Postprocedure Evaluation (Signed)
Anesthesia Post Note  Patient: Dale CoriaBobby L Benjamin  Procedure(s) Performed: Procedure(s) (LRB): HERNIA REPAIR INGUINAL INCARCERATED (Right)  Patient location during evaluation: PACU Anesthesia Type: General Level of consciousness: awake and alert and patient cooperative Pain management: pain level controlled Vital Signs Assessment: post-procedure vital signs reviewed and stable Respiratory status: spontaneous breathing and patient connected to face mask oxygen Cardiovascular status: stable Postop Assessment: no signs of nausea or vomiting Anesthetic complications: no     Last Vitals:  Vitals:   04/15/16 1200 04/15/16 1330  BP: 132/89   Pulse:    Resp: 20   Temp:  (P) 36.8 C    Last Pain:  Vitals:   04/15/16 1014  TempSrc: Oral  PainSc:                  Dale Benjamin

## 2016-04-15 NOTE — H&P (Signed)
Dale Benjamin is an 45 y.o. male.   Chief Complaint: Incarcerated right inguinal hernia HPI: Patient is a 45 year old white male who was just discharged yesterday from [facility] with a partial small bowel obstruction secondary to incarcerated right inguinal hernia that had been reduced. He presented back to the emergency room today with recurrent episodes of nausea, vomiting, and right inguinal swelling. He is status post right inguinal herniorrhaphy in the remote past by another physician. No fever, chills, or jaundice are noted. His pain is 7 out of 10.  Past Medical History:  Diagnosis Date  . Seasonal allergies     Past Surgical History:  Procedure Laterality Date  . HERNIA REPAIR Right 2008    Family History  Problem Relation Age of Onset  . Allergies Mother    Social History:  reports that he has been smoking Cigarettes.  He has been smoking about 0.50 packs per day. He has never used smokeless tobacco. He reports that he drinks alcohol. He reports that he does not use drugs.  Allergies: No Known Allergies   (Not in a hospital admission)  Results for orders placed or performed during the hospital encounter of 04/13/16 (from the past 48 hour(s))  Urinalysis, Routine w reflex microscopic     Status: Abnormal   Collection Time: 04/13/16  5:54 PM  Result Value Ref Range   Color, Urine YELLOW YELLOW   APPearance CLEAR CLEAR   Specific Gravity, Urine 1.020 1.005 - 1.030   pH 6.0 5.0 - 8.0   Glucose, UA NEGATIVE NEGATIVE mg/dL   Hgb urine dipstick NEGATIVE NEGATIVE   Bilirubin Urine NEGATIVE NEGATIVE   Ketones, ur 15 (A) NEGATIVE mg/dL   Protein, ur NEGATIVE NEGATIVE mg/dL   Nitrite NEGATIVE NEGATIVE   Leukocytes, UA NEGATIVE NEGATIVE    Comment: Microscopic not done on urines with negative protein, blood, leukocytes, nitrite, or glucose < 500 mg/dL.  Lipase, blood     Status: None   Collection Time: 04/13/16  7:08 PM  Result Value Ref Range   Lipase 21 11 - 51  U/L  Comprehensive metabolic panel     Status: Abnormal   Collection Time: 04/13/16  7:08 PM  Result Value Ref Range   Sodium 136 135 - 145 mmol/L   Potassium 4.6 3.5 - 5.1 mmol/L   Chloride 97 (L) 101 - 111 mmol/L   CO2 31 22 - 32 mmol/L   Glucose, Bld 116 (H) 65 - 99 mg/dL   BUN 23 (H) 6 - 20 mg/dL   Creatinine, Ser 0.92 0.61 - 1.24 mg/dL   Calcium 9.8 8.9 - 10.3 mg/dL   Total Protein 7.9 6.5 - 8.1 g/dL   Albumin 4.7 3.5 - 5.0 g/dL   AST 38 15 - 41 U/L   ALT 33 17 - 63 U/L   Alkaline Phosphatase 67 38 - 126 U/L   Total Bilirubin 0.9 0.3 - 1.2 mg/dL   GFR calc non Af Amer >60 >60 mL/min   GFR calc Af Amer >60 >60 mL/min    Comment: (NOTE) The eGFR has been calculated using the CKD EPI equation. This calculation has not been validated in all clinical situations. eGFR's persistently <60 mL/min signify possible Chronic Kidney Disease.    Anion gap 8 5 - 15  CBC     Status: Abnormal   Collection Time: 04/13/16  7:08 PM  Result Value Ref Range   WBC 14.0 (H) 4.0 - 10.5 K/uL   RBC 5.17 4.22 -  5.81 MIL/uL   Hemoglobin 17.0 13.0 - 17.0 g/dL   HCT 50.9 39.0 - 52.0 %   MCV 98.5 78.0 - 100.0 fL   MCH 32.9 26.0 - 34.0 pg   MCHC 33.4 30.0 - 36.0 g/dL   RDW 14.2 11.5 - 15.5 %   Platelets 241 150 - 400 K/uL  Lactic acid, plasma     Status: None   Collection Time: 04/13/16 10:46 PM  Result Value Ref Range   Lactic Acid, Venous 1.1 0.5 - 1.9 mmol/L  Magnesium     Status: Abnormal   Collection Time: 04/13/16 10:47 PM  Result Value Ref Range   Magnesium 2.5 (H) 1.7 - 2.4 mg/dL  Lactic acid, plasma     Status: None   Collection Time: 04/14/16  2:00 AM  Result Value Ref Range   Lactic Acid, Venous 1.0 0.5 - 1.9 mmol/L  CBC WITH DIFFERENTIAL     Status: Abnormal   Collection Time: 04/14/16  2:00 AM  Result Value Ref Range   WBC 13.5 (H) 4.0 - 10.5 K/uL   RBC 4.66 4.22 - 5.81 MIL/uL   Hemoglobin 15.1 13.0 - 17.0 g/dL   HCT 45.6 39.0 - 52.0 %   MCV 97.9 78.0 - 100.0 fL   MCH 32.4  26.0 - 34.0 pg   MCHC 33.1 30.0 - 36.0 g/dL   RDW 14.4 11.5 - 15.5 %   Platelets 205 150 - 400 K/uL   Neutrophils Relative % 85 %   Neutro Abs 11.4 (H) 1.7 - 7.7 K/uL   Lymphocytes Relative 8 %   Lymphs Abs 1.1 0.7 - 4.0 K/uL   Monocytes Relative 7 %   Monocytes Absolute 0.9 0.1 - 1.0 K/uL   Eosinophils Relative 0 %   Eosinophils Absolute 0.1 0.0 - 0.7 K/uL   Basophils Relative 0 %   Basophils Absolute 0.0 0.0 - 0.1 K/uL  Comprehensive metabolic panel     Status: Abnormal   Collection Time: 04/14/16  2:00 AM  Result Value Ref Range   Sodium 136 135 - 145 mmol/L   Potassium 4.1 3.5 - 5.1 mmol/L   Chloride 98 (L) 101 - 111 mmol/L   CO2 30 22 - 32 mmol/L   Glucose, Bld 110 (H) 65 - 99 mg/dL   BUN 22 (H) 6 - 20 mg/dL   Creatinine, Ser 0.86 0.61 - 1.24 mg/dL   Calcium 9.1 8.9 - 10.3 mg/dL   Total Protein 6.8 6.5 - 8.1 g/dL   Albumin 4.1 3.5 - 5.0 g/dL   AST 27 15 - 41 U/L   ALT 29 17 - 63 U/L   Alkaline Phosphatase 57 38 - 126 U/L   Total Bilirubin 0.8 0.3 - 1.2 mg/dL   GFR calc non Af Amer >60 >60 mL/min   GFR calc Af Amer >60 >60 mL/min    Comment: (NOTE) The eGFR has been calculated using the CKD EPI equation. This calculation has not been validated in all clinical situations. eGFR's persistently <60 mL/min signify possible Chronic Kidney Disease.    Anion gap 8 5 - 15   Ct Abdomen Pelvis W Contrast  Result Date: 04/13/2016 CLINICAL DATA:  Mid and lower abdominal pain for 2 days. Vomiting. Nausea. EXAM: CT ABDOMEN AND PELVIS WITH CONTRAST TECHNIQUE: Multidetector CT imaging of the abdomen and pelvis was performed using the standard protocol following bolus administration of intravenous contrast. CONTRAST:  161m ISOVUE-300 IOPAMIDOL (ISOVUE-300) INJECTION 61% COMPARISON:  CT 02/15/2016 FINDINGS: Lower chest: Allowing for  motion artifact, the lung bases are clear. Hepatobiliary: No focal liver abnormality is seen. No gallstones, gallbladder wall thickening, or biliary  dilatation. Pancreas: Unremarkable. No pancreatic ductal dilatation or surrounding inflammatory changes. Spleen: Normal in size without focal abnormality. Adrenals/Urinary Tract: Normal adrenal glands. No hydronephrosis or perinephric edema. Extrarenal pelvis configuration on the right. Small cyst in the lower left kidney is unchanged. Urinary bladder is physiologically distended without wall thickening. Stomach/Bowel: Stomach distended with ingested contrast. Diffuse fluid-filled small bowel dilatation. Small bowel dilated to transition point within a right inguinal hernia. The exiting and distal most small bowel loops are decompressed. There is mesenteric edema and free fluid but no pneumatosis. No perforation. The colon is relatively decompressed. Portions of the appendix tentatively identified without inflammation. Vascular/Lymphatic: No significant vascular findings are present. No enlarged abdominal or pelvic lymph nodes. Reproductive: Prostate is unremarkable. Other: Mesenteric edema and small volume of free fluid. No evidence of free air. There is fat and trace fluid in the left inguinal canal. Musculoskeletal: There are no acute or suspicious osseous abnormalities. IMPRESSION: Small bowel obstruction secondary to right inguinal hernia. Mesenteric edema and free fluid but no evidence of perforation. Electronically Signed   By: Jeb Levering M.D.   On: 04/13/2016 22:03   Dg Abd 2 Views  Result Date: 04/14/2016 CLINICAL DATA:  Small bowel obstruction EXAM: ABDOMEN - 2 VIEW COMPARISON:  CT abdomen and pelvis April 13, 2016 FINDINGS: Supine and upright images obtained. There are multiple loops of dilated small bowel multiple air-fluid levels and pattern consistent with bowel obstruction. No free air evident. Lung bases clear. There is contrast in the urinary bladder. IMPRESSION: Bowel gas pattern consistent with small bowel obstruction. Electronically Signed   By: Lowella Grip III M.D.   On:  04/14/2016 08:55    Review of Systems  Constitutional: Positive for malaise/fatigue.  HENT: Negative.   Eyes: Negative.   Respiratory: Negative.   Cardiovascular: Negative.   Gastrointestinal: Positive for abdominal pain, nausea and vomiting.  Genitourinary: Negative.   Skin: Negative.   Endo/Heme/Allergies: Negative.   All other systems reviewed and are negative.   Blood pressure 143/95, pulse 74, temperature 98.2 F (36.8 C), temperature source Oral, resp. rate 20, height _0  (1.727 m), weight 50.3 kg (111 lb), SpO2 99 %. Physical Exam  Constitutional: He is oriented to person, place, and time. He appears well-developed and well-nourished. No distress.  HENT:  Head: Normocephalic and atraumatic.  Eyes: No scleral icterus.  Neck: Normal range of motion. Neck supple.  Cardiovascular: Normal rate, regular rhythm and normal heart sounds.   Respiratory: Effort normal and breath sounds normal.  GI: Soft. Bowel sounds are normal. He exhibits distension. There is tenderness.  Patient has an incarcerated right inguinal hernia that has been reduced in the past, but keeps coming out. There is difficulty in reducing her today. No erythema is noted. No abdominal rigidity is noted.  Neurological: He is alert and oriented to person, place, and time.  Skin: Skin is warm and dry.     Assessment/Plan Impression: Incarcerated recurrent right inguinal hernia with partial small bowel obstruction Plan: We'll take patient to the operating room for a right inguinal herniorrhaphy with mesh, recurrent. The risks and benefits of the procedure including bleeding, infection, mesh use, the possibility of a bowel resection as well as recurrence of the hernia were fully explained to the patient, who gave informed consent.  Jamesetta So, MD 04/15/2016, 10:55 AM

## 2016-04-15 NOTE — Op Note (Addendum)
Patient:  Dale Benjamin  DOB:  March 23, 1971  MRN:  [id number]   Preop Diagnosis:  Recurrent incarcerated right inguinal hernia  Postop Diagnosis:  Same  Procedure:  Recurrent incarcerated right inguinal herniorrhaphy with mesh  Surgeon:  Franky MachoMark Adasia Hoar, M.D.  Anes:  Gen. endotracheal  Indications:  Patient is a 45 year old white male status post a right inguinal herniorrhaphy in the remote past who was just discharged from the hospital yesterday with an incarcerated right inguinal hernia that was reduced. A repeat present to the emergency room with incarceration. He has now been taken to the operating room urgently for right anal herniorrhaphy with possible small bowel resection. The risks and benefits of the procedure including bleeding, infection, mesh use, and the possibility of bowel resection were fully explained to the patient, who gave informed consent.  Procedure note:  The patient was placed in the supine position. After induction of general endotracheal anesthesia, the right groin region was prepped and draped using usual sterile technique with Betadine. Surgical site confirmation was performed.  Incision was made to the previous surgical incision site. This dissection was taken down to hernia. The hernia sac was opened without difficulty. The hernia defect was just lateral to the pubic tubercle. A knuckle small bowel was present. It was mildly ischemic, but peristalsed nicely. It was reduced after the tendon was divided. I then waited for about 5 minutes and reinspected the bowel. It had thickened up in the was no evidence of perforation. The remaining hernia sac was then excised and disposed of. The perineum was closed using a 2-0 Novafil pursestring suture. A 4.3 cm Bard ventralax ST patch was then inserted and secured to the fascia using a 2-0 Novafil interrupted suture. The overlying fascia was reapproximated using a 2-0 Novafil interrupted suture. The subcutaneous layer was  reapproximated using a 3-0 Vicryl interrupted suture.  Exparel was instilled into the surrounding wound. The skin was closed using staples. Betadine ointment and dry sterile dressings were applied.  All tape and needle counts were correct the end of the procedure. Patient was extubated in the operating room and transferred to PACU in stable condition.  Complications:  None  EBL:  Minimal  Specimen:  None

## 2016-04-16 LAB — CBC
HEMATOCRIT: 40.8 % (ref 39.0–52.0)
HEMOGLOBIN: 13.8 g/dL (ref 13.0–17.0)
MCH: 33.1 pg (ref 26.0–34.0)
MCHC: 33.8 g/dL (ref 30.0–36.0)
MCV: 97.8 fL (ref 78.0–100.0)
Platelets: 170 10*3/uL (ref 150–400)
RBC: 4.17 MIL/uL — AB (ref 4.22–5.81)
RDW: 13.8 % (ref 11.5–15.5)
WBC: 7 10*3/uL (ref 4.0–10.5)

## 2016-04-16 LAB — BASIC METABOLIC PANEL
ANION GAP: 6 (ref 5–15)
BUN: 13 mg/dL (ref 6–20)
CO2: 28 mmol/L (ref 22–32)
Calcium: 8.2 mg/dL — ABNORMAL LOW (ref 8.9–10.3)
Chloride: 99 mmol/L — ABNORMAL LOW (ref 101–111)
Creatinine, Ser: 0.82 mg/dL (ref 0.61–1.24)
Glucose, Bld: 138 mg/dL — ABNORMAL HIGH (ref 65–99)
POTASSIUM: 3.8 mmol/L (ref 3.5–5.1)
SODIUM: 133 mmol/L — AB (ref 135–145)

## 2016-04-16 NOTE — Discharge Instructions (Signed)
Inguinal Hernia, Adult Introduction An inguinal hernia is when fat or the intestines push through the area where the leg meets the lower belly (groin) and make a rounded lump (bulge). This condition happens over time. There are three types of inguinal hernias. These types include:  Hernias that can be pushed back into the belly (are reducible).  Hernias that cannot be pushed back into the belly (are incarcerated).  Hernias that cannot be pushed back into the belly and lose their blood supply (get strangulated). This type needs emergency surgery. Follow these instructions at home: Lifestyle  Drink enough fluid to keep your urine (pee) clear or pale yellow.  Eat plenty of fruits, vegetables, and whole grains. These have a lot of fiber. Talk with your doctor if you have questions.  Avoid lifting heavy objects.  Avoid standing for long periods of time.  Do not use tobacco products. These include cigarettes, chewing tobacco, or e-cigarettes. If you need help quitting, ask your doctor.  Try to stay at a healthy weight. General instructions  Do not try to force the hernia back in.  Watch your hernia for any changes in color or size. Let your doctor know if there are any changes.  Take over-the-counter and prescription medicines only as told by your doctor.  Keep all follow-up visits as told by your doctor. This is important. Contact a doctor if:  You have a fever.  You have new symptoms.  Your symptoms get worse. Get help right away if:  The area where the legs meets the lower belly has:  Pain that gets worse suddenly.  A bulge that gets bigger suddenly and does not go down.  A bulge that turns red or purple.  A bulge that is painful to the touch.  You are a man and your scrotum:  Suddenly feels painful.  Suddenly changes in size.  You feel sick to your stomach (nauseous) and this feeling does not go away.  You throw up (vomit) and this keeps happening.  You feel  your heart beating a lot more quickly than normal.  You cannot poop (have a bowel movement) or pass gas. This information is not intended to replace advice given to you by your health care provider. Make sure you discuss any questions you have with your health care provider. Document Released: 05/05/2006 Document Revised: 09/10/2015 Document Reviewed: 02/12/2014  2017 Elsevier  

## 2016-04-16 NOTE — Discharge Summary (Signed)
Physician Discharge Summary  Patient ID: Dale Benjamin MRN: [id number] DOB/AGE: 1971/04/16 45 y.o.  Admit date: 04/15/2016 Discharge date: 04/16/2016  Admission Diagnoses:Recurrent right inguinal hernia with incarceration  Discharge Diagnoses: Same Active Problems:   Right inguinal hernia   Recurrent inguinal hernia of right side with obstruction   Discharged Condition: good  Hospital Course: Patient is a 45 year old white male who had a known recurrent right inguinal hernia who was recently discharged on 04/14/2016 with a reduced right inguinal hernia. He went home and on the following morning, started having nausea and vomiting. The swelling recurred in the right groin region. He was taken to the operating room on 04/15/2016 and underwent a right inguinal herniorrhaphy with mesh. The bowel was incarcerated but was reduced without needing bowel resection. He tolerated the procedure well. His postoperative course was unremarkable. His diet was advanced without difficulty. The patient is being discharged home on 04/16/2016 in good improving condition.  Treatments: surgery: Recurrent incarcerated right inguinal herniorrhaphy with mesh on 04/15/2016  Discharge Exam: Blood pressure 107/69, pulse 77, temperature 98.9 F (37.2 C), temperature source Oral, resp. rate 18, height 5\' 8"  (1.727 m), weight 50.3 kg (111 lb), SpO2 97 %. General appearance: alert, cooperative and no distress Resp: clear to auscultation bilaterally Cardio: regular rate and rhythm, S1, S2 normal, no murmur, click, rub or gallop GI: soft, non-tender; bowel sounds normal; no masses,  no organomegaly and Right groin incision healing well.  Disposition: 01-Home or Self Care   Allergies as of 04/16/2016   No Known Allergies     Medication List    You have not been prescribed any medications.    Follow-up Information    Dalia HeadingJENKINS,Behr Cislo A, MD. Schedule an appointment as soon as possible for a visit on 04/26/2016.    Specialty:  General Surgery Contact information: [address] [place] [id number] [phone]           Signed: Franky MachoJENKINS,Kenndra Morris A 04/16/2016, 9:46 AM

## 2016-04-19 ENCOUNTER — Encounter (HOSPITAL_COMMUNITY): Payer: Self-pay | Admitting: General Surgery

## 2018-01-09 ENCOUNTER — Emergency Department (HOSPITAL_COMMUNITY): Payer: BLUE CROSS/BLUE SHIELD

## 2018-01-09 ENCOUNTER — Other Ambulatory Visit: Payer: Self-pay

## 2018-01-09 ENCOUNTER — Emergency Department (HOSPITAL_COMMUNITY)
Admission: EM | Admit: 2018-01-09 | Discharge: 2018-01-09 | Disposition: A | Discharge status: Home / Self Care | Payer: BLUE CROSS/BLUE SHIELD | Attending: Emergency Medicine | Admitting: Emergency Medicine

## 2018-01-09 ENCOUNTER — Encounter (HOSPITAL_COMMUNITY): Payer: Self-pay

## 2018-01-09 DIAGNOSIS — Y939 Activity, unspecified: Secondary | ICD-10-CM | POA: Insufficient documentation

## 2018-01-09 DIAGNOSIS — X501XXA Overexertion from prolonged static or awkward postures, initial encounter: Secondary | ICD-10-CM | POA: Diagnosis not present

## 2018-01-09 DIAGNOSIS — F17210 Nicotine dependence, cigarettes, uncomplicated: Secondary | ICD-10-CM | POA: Insufficient documentation

## 2018-01-09 DIAGNOSIS — Y999 Unspecified external cause status: Secondary | ICD-10-CM | POA: Insufficient documentation

## 2018-01-09 DIAGNOSIS — W1842XA Slipping, tripping and stumbling without falling due to stepping into hole or opening, initial encounter: Secondary | ICD-10-CM | POA: Diagnosis not present

## 2018-01-09 DIAGNOSIS — S99921A Unspecified injury of right foot, initial encounter: Secondary | ICD-10-CM | POA: Diagnosis present

## 2018-01-09 DIAGNOSIS — S92301A Fracture of unspecified metatarsal bone(s), right foot, initial encounter for closed fracture: Secondary | ICD-10-CM | POA: Insufficient documentation

## 2018-01-09 DIAGNOSIS — Y929 Unspecified place or not applicable: Secondary | ICD-10-CM | POA: Insufficient documentation

## 2018-01-09 HISTORY — DX: Dorsalgia, unspecified: M54.9

## 2018-01-09 MED ORDER — OXYCODONE-ACETAMINOPHEN 5-325 MG PO TABS: 1.0000 | ORAL_TABLET | ORAL | 0 refills | Status: DC | PRN

## 2018-01-09 MED ORDER — HYDROCODONE-ACETAMINOPHEN 5-325 MG PO TABS
1.0000 | ORAL_TABLET | Freq: Once | ORAL | Status: AC
  Administered 2018-01-09: 1 via ORAL
  Filled 2018-01-09: qty 1

## 2018-01-09 MED ORDER — OXYCODONE-ACETAMINOPHEN 5-325 MG PO TABS
2.0000 | ORAL_TABLET | Freq: Once | ORAL | Status: AC
  Administered 2018-01-09: 2 via ORAL
  Filled 2018-01-09: qty 2

## 2018-01-09 NOTE — Discharge Instructions (Signed)
Keep your foot elevated as much as possible to help reduce the swelling.  This will also help with pain.  Avoid weight bearing at all times by using your crutches.  Call for close follow up care as discussed. Get rechecked immediately if you have any worsened pain or problems with your splint.

## 2018-01-09 NOTE — ED Triage Notes (Signed)
Pt reports stepping in a hole last night and felt ankle twist . Right ankle , foot, toes swollen. Bruising to entire foot and and black blisters to first toe

## 2018-01-10 NOTE — ED Provider Notes (Signed)
[facility] EMERGENCY DEPARTMENT Provider Note   CSN: [id number] Arrival date & time: 01/09/18  1818     History   Chief Complaint Chief Complaint  Patient presents with  . Foot Pain    HPI Dale Benjamin is a 47 y.o. male with no significant chronic medical conditions presenting with right foot pain, swelling and severe bruising since stepping in a hole last night, twisting his foot and ankle.  He states he had to walk about 1/2 mile to get back home after this event.  He has been using ice and elevation but continues to have persistent pain and increasing difficulty weight bearing.  He denies radiation of pain and any other injury.  The history is provided by the patient.    Past Medical History:  Diagnosis Date  . Back pain   . Seasonal allergies     Patient Active Problem List   Diagnosis Date Noted  . 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 SBO (small bowel obstruction) (HCC) 04/13/2016  . Right inguinal hernia 04/13/2016    Past Surgical History:  Procedure Laterality Date  . HERNIA REPAIR Right 2008  . INGUINAL HERNIA REPAIR Right 04/15/2016   Procedure: HERNIA REPAIR INGUINAL INCARCERATED;  Surgeon: Franky Macho, MD;  Location: [facility];  Service: General;  Laterality: Right;        Home Medications    Prior to Admission medications   Medication Sig Start Date End Date Taking? Authorizing Provider  oxyCODONE-acetaminophen (PERCOCET/ROXICET) 5-325 MG tablet Take 1 tablet by mouth every 4 (four) hours as needed. 01/09/18   Burgess Amor, PA-C  oxyCODONE-acetaminophen (PERCOCET/ROXICET) 5-325 MG tablet Take 1 tablet by mouth every 4 (four) hours as needed for severe pain. 01/09/18   Burgess Amor, PA-C    Family History Family History  Problem Relation Age of Onset  . Allergies Mother     Social History Social History   Tobacco Use  . Smoking status: Current Every Day Smoker    Packs/day: 0.50    Types: Cigarettes  .  Smokeless tobacco: Never Used  Substance Use Topics  . Alcohol use: Yes    Alcohol/week: 3.0 standard drinks    Types: 3 Cans of beer per week    Comment: occ.  . Drug use: No     Allergies   Patient has no known allergies.   Review of Systems Review of Systems  Constitutional: Negative for fever.  Musculoskeletal: Positive for arthralgias and joint swelling. Negative for myalgias.  Skin: Positive for color change.  Neurological: Negative for weakness and numbness.     Physical Exam Updated Vital Signs BP 123/80 (BP Location: Right Arm)   Pulse 70   Temp 98.6 F (37 C) (Oral)   Resp 18   SpO2 100%   Physical Exam  Constitutional: He appears well-developed and well-nourished.  HENT:  Head: Atraumatic.  Neck: Normal range of motion.  Cardiovascular:  Pulses equal bilaterally  Musculoskeletal: He exhibits edema and tenderness.       Right foot: There is decreased range of motion, bony tenderness and swelling. There is normal capillary refill and no deformity.  Advanced edema plantar and dorsal right foot.  Severe bruising noted including a blood filled bulla at the base of his great and 2nd toes which is intact.  Distal sensation intact. Pt can wriggle toes although painful. Less than 2 sec cap refill in toes. Ankle with ttp and edema as well, pain localizes to the mid  foot.   Neurological: He is alert. He has normal strength. He displays normal reflexes. No sensory deficit.  Skin: Skin is warm and dry.  Psychiatric: He has a normal mood and affect.     ED Treatments / Results  Labs (all labs ordered are listed, but only abnormal results are displayed) Labs Reviewed - No data to display  EKG None  Radiology Dg Ankle Complete Right  Result Date: 01/09/2018 CLINICAL DATA:  Fall.  Right ankle pain. EXAM: RIGHT ANKLE - COMPLETE 3+ VIEW COMPARISON:  None. FINDINGS: Diffuse right ankle soft tissue swelling, most prominent laterally. No fracture or subluxation. No  suspicious focal osseous lesions. No radiopaque foreign body. IMPRESSION: Diffuse right ankle soft tissue swelling, with no fracture or subluxation. Electronically Signed   By: Delbert PhenixJason A Poff M.D.   On: 01/09/2018 19:12   Ct Foot Right Wo Contrast  Result Date: 01/09/2018 CLINICAL DATA:  Foot trauma, Lisfranc fracture suspected EXAM: CT OF THE RIGHT FOOT WITHOUT CONTRAST TECHNIQUE: Multidetector CT imaging of the right foot was performed according to the standard protocol. Multiplanar CT image reconstructions were also generated. COMPARISON:  01/09/2018 radiographs. FINDINGS: Bones/Joint/Cartilage The distal tibia and fibula are unremarkable. The tibiotalar, subtalar and midfoot articulations are congruent. No widening of the Lisfranc joint. The following fractures are identified on CT, radiographically occult in appearance due to overlap: 1. Curvilinear avulsed fracture fragments off the dorsal lateral aspect of the distal cuboid, series 5/152 and series 6/175 measuring between 5 and 6 mm each. 2. Acute slightly comminuted fracture of the base of the fourth metatarsal without intra-articular extension. 2 mm of medial displacement of the distal main fracture fragment is identified, series 6/77 and series 5/140. 3. Acute fracture at the base and proximal diaphysis of the second metatarsal less significant displacement, series 6/82 and series 5/141. 4. Transverse lucency of the medial first sesamoid of the first metatarsal head. Given sclerotic margins, this may represent a bipartite sesamoid versus stigmata of old remote sesamoid fracture. Osteoarthritis of the first MTP. Ligaments Suboptimally assessed by CT. Muscles and Tendons No muscle atrophy or intramuscular hemorrhage. Soft tissues Moderate diffuse soft tissue edema of the included ankle and foot more so laterally at the ankle and along the dorsum of the foot. Mild soft tissue nodularity seen along the dorsum of the foot some which may represent blisters.  IMPRESSION: 1. Comminuted fractures along the plantar proximal aspect of the second and third metatarsal shafts with 2 mm of medial displacement of the main fracture fragment involving the fourth metatarsal. The base of the metatarsals however appear to maintain their alignment at the MTP joints without abnormal widening of the Lisfranc ligament space. 2. Small avulsion fracture involving the distal cuboid along its dorsal lateral aspect. 3. Bipartite medial sesamoid versus stigmata remote sesamoid fracture with transverse lucency noted but with sclerotic appearing margins. If the patient has pain referable to this region, consider a more recent fracture. Electronically Signed   By: Tollie Ethavid  Kwon M.D.   On: 01/09/2018 21:19   Dg Foot Complete Right  Result Date: 01/09/2018 CLINICAL DATA:  Fall.  Right foot pain. EXAM: RIGHT FOOT COMPLETE - 3+ VIEW COMPARISON:  None. FINDINGS: No fracture or dislocation. No suspicious focal osseous lesion. Minimal first MTP joint osteoarthritis. No radiopaque foreign body. IMPRESSION: No right foot fracture or malalignment. Minimal first MTP joint osteoarthritis. Electronically Signed   By: Delbert PhenixJason A Poff M.D.   On: 01/09/2018 19:11    Procedures Procedures (including critical care time)  SPLINT APPLICATION Date/Time: 4:07 PM Authorized by: Burgess Amor Consent: Verbal consent obtained. Risks and benefits: risks, benefits and alternatives were discussed Consent given by: patient Splint applied by: RN Location details: right lower extremity Splint type: short leg posterior splint Supplies used: webril, thick layer to protect the dorsal bullas, fiberglass, ace wraps. Post-procedure: The splinted body part was neurovascularly unchanged following the procedure. Patient tolerance: Patient tolerated the procedure well with no immediate complications.     Medications Ordered in ED Medications  HYDROcodone-acetaminophen (NORCO/VICODIN) 5-325 MG per tablet 1 tablet (1  tablet Oral Given 01/09/18 2018)  oxyCODONE-acetaminophen (PERCOCET/ROXICET) 5-325 MG per tablet 2 tablet (2 tablets Oral Given 01/09/18 2140)     Initial Impression / Assessment and Plan / ED Course  I have reviewed the triage vital signs and the nursing notes.  Pertinent labs & imaging results that were available during my care of the patient were reviewed by me and considered in my medical decision making (see chart for details).  Clinical Course as of Jan 10 1557  Tue Jan 09, 2018  [id number] 47 year old male with injury to right foot and ankle after he fell into a hole while ambulating.  Sounds like this happened yesterday.  Since then he has had severe pain in unable to ambulate or put weight on it.  Skin a markedly swollen foot with a lot of ecchymosis and even some hemorrhagic blisters between his toes.  X-rays are negative but I be concerned that he is at least got some ligamentous damage we will proceed with CT case this is a Lisfranc.   [MB]    Clinical Course User Index [MB] Terrilee Files, MD    Imaging reviewed and discussed with pt. Placed in posterior splint, crutches provided. Advised no weight bearing, ice, elevation, f/u with ortho asap this week for a recheck and further management of his injury.  Oxycodone provided.   Final Clinical Impressions(s) / ED Diagnoses   Final diagnoses:  Multiple closed fractures of metatarsal bone of right foot, initial encounter    ED Discharge Orders         Ordered    oxyCODONE-acetaminophen (PERCOCET/ROXICET) 5-325 MG tablet  Every 4 hours PRN     01/09/18 2139    oxyCODONE-acetaminophen (PERCOCET/ROXICET) 5-325 MG tablet  Every 4 hours PRN     01/09/18 2144           Burgess Amor, PA-C 01/10/18 1608    Terrilee Files, MD 01/10/18 2023

## 2018-01-11 ENCOUNTER — Encounter: Payer: Self-pay | Admitting: Orthopaedic Surgery

## 2018-01-11 ENCOUNTER — Ambulatory Visit: Payer: BLUE CROSS/BLUE SHIELD | Admitting: Orthopaedic Surgery

## 2018-01-11 VITALS — BP 124/73 | HR 98 | Ht 68.0 in | Wt 130.0 lb

## 2018-01-11 DIAGNOSIS — S92324A Nondisplaced fracture of second metatarsal bone, right foot, initial encounter for closed fracture: Secondary | ICD-10-CM

## 2018-01-11 DIAGNOSIS — S92334A Nondisplaced fracture of third metatarsal bone, right foot, initial encounter for closed fracture: Secondary | ICD-10-CM | POA: Diagnosis not present

## 2018-01-11 DIAGNOSIS — S92344A Nondisplaced fracture of fourth metatarsal bone, right foot, initial encounter for closed fracture: Secondary | ICD-10-CM | POA: Diagnosis not present

## 2018-01-11 DIAGNOSIS — S92214A Nondisplaced fracture of cuboid bone of right foot, initial encounter for closed fracture: Secondary | ICD-10-CM | POA: Diagnosis not present

## 2018-01-11 MED ORDER — HYDROCODONE-ACETAMINOPHEN 7.5-325 MG PO TABS: ORAL_TABLET | ORAL | 0 refills | Status: DC

## 2018-01-11 NOTE — Progress Notes (Signed)
Subjective:    Patient ID: Dale Benjamin, male    DOB: December 07, 1970, 47 y.o.   MRN: [id number]  HPI He stepped in a hole on 01-08-18.  He kept walking on his foot despite the pain.  He went to the ER the following day.  X-rays were done and were negative of the right foot but the ER suspected a possible Lisfranc dislocation and obtained a CT scan.  It showed: IMPRESSION: 1. Comminuted fractures along the plantar proximal aspect of the second and third metatarsal shafts with 2 mm of medial displacement of the main fracture fragment involving the fourth metatarsal. The base of the metatarsals however appear to maintain their alignment at the MTP joints without abnormal widening of the Lisfranc ligament space. 2. Small avulsion fracture involving the distal cuboid along its dorsal lateral aspect. 3. Bipartite medial sesamoid versus stigmata remote sesamoid fracture with transverse lucency noted but with sclerotic appearing margins. If the patient has pain referable to this region, consider a more recent fracture.  He was placed in a posterior splint and told to come here.  He lost his wallet with his prescriptions.  He has crutches.  He has no other injury.  I have reviewed the ER records, the x-rays and x-rays reports.   Review of Systems  Constitutional: Positive for activity change.  Musculoskeletal: Positive for arthralgias, back pain, gait problem and joint swelling.  Allergic/Immunologic: Positive for environmental allergies.  All other systems reviewed and are negative.  For Review of Systems, all other systems reviewed and are negative.  The following is a summary of the past history medically, past history surgically, known current medicines, social history and family history.  This information is gathered electronically by the computer from prior information and documentation.  I review this each visit and have found including this information at this point in the chart is  beneficial and informative.   Past Medical History:  Diagnosis Date  . Back pain   . Seasonal allergies     Past Surgical History:  Procedure Laterality Date  . HERNIA REPAIR Right 2008  . INGUINAL HERNIA REPAIR Right 04/15/2016   Procedure: HERNIA REPAIR INGUINAL INCARCERATED;  Surgeon: Franky Macho, MD;  Location: [facility];  Service: General;  Laterality: Right;    Current Outpatient Medications on File Prior to Visit  Medication Sig Dispense Refill  . oxyCODONE-acetaminophen (PERCOCET/ROXICET) 5-325 MG tablet Take 1 tablet by mouth every 4 (four) hours as needed. (Patient not taking: Reported on 01/11/2018) 20 tablet 0  . oxyCODONE-acetaminophen (PERCOCET/ROXICET) 5-325 MG tablet Take 1 tablet by mouth every 4 (four) hours as needed for severe pain. (Patient not taking: Reported on 01/11/2018) 6 tablet 0   No current facility-administered medications on file prior to visit.     Social History   Socioeconomic History  . Marital status: Single    Spouse name: Not on file  . Number of children: Not on file  . Years of education: Not on file  . Highest education level: Not on file  Occupational History  . Not on file  Social Needs  . Financial resource strain: Not on file  . Food insecurity:    Worry: Not on file    Inability: Not on file  . Transportation needs:    Medical: Not on file    Non-medical: Not on file  Tobacco Use  . Smoking status: Current Every Day Smoker    Packs/day: 0.50    Types: Cigarettes  . Smokeless  tobacco: Never Used  Substance and Sexual Activity  . Alcohol use: Yes    Alcohol/week: 3.0 standard drinks    Types: 3 Cans of beer per week    Comment: occ.  . Drug use: No  . Sexual activity: Not on file  Lifestyle  . Physical activity:    Days per week: Not on file    Minutes per session: Not on file  . Stress: Not on file  Relationships  . Social connections:    Talks on phone: Not on file    Gets together: Not on file    Attends  religious service: Not on file    Active member of club or organization: Not on file    Attends meetings of clubs or organizations: Not on file    Relationship status: Not on file  . Intimate partner violence:    Fear of current or ex partner: Not on file    Emotionally abused: Not on file    Physically abused: Not on file    Forced sexual activity: Not on file  Other Topics Concern  . Not on file  Social History Narrative  . Not on file    Family History  Problem Relation Age of Onset  . Allergies Mother     BP 124/73   Pulse 98   Ht 5\' 8"  (1.727 m)   Wt 130 lb (59 kg)   BMI 19.77 kg/m   Body mass index is 19.77 kg/m.     Objective:   Physical Exam  Constitutional: He is oriented to person, place, and time. He appears well-developed and well-nourished.  HENT:  Head: Normocephalic and atraumatic.  Eyes: Pupils are equal, round, and reactive to light. Conjunctivae and EOM are normal.  Neck: Normal range of motion. Neck supple.  Cardiovascular: Normal rate, regular rhythm and intact distal pulses.  Pulmonary/Chest: Effort normal.  Abdominal: Soft.  Musculoskeletal:       Feet:  Neurological: He is alert and oriented to person, place, and time. He has normal reflexes. He displays normal reflexes. No cranial nerve deficit. He exhibits normal muscle tone. Coordination normal.  Skin: Skin is warm and dry.  Psychiatric: He has a normal mood and affect. His behavior is normal. Judgment and thought content normal.          Assessment & Plan:   Encounter Diagnoses  Name Primary?  . Closed nondisplaced fracture of cuboid of right foot, initial encounter Yes  . Closed nondisplaced fracture of second metatarsal bone of right foot, initial encounter   . Closed nondisplaced fracture of third metatarsal bone of right foot, initial encounter   . Closed nondisplaced fracture of fourth metatarsal bone of right foot, initial encounter    The splint was reapplied after  cushioning the heel area.  Samples of doxycycline 100 were given, dispense 20, one bid for five days.  Return on Tuesday.  Do not remove the dressings.  Do not bear weight, use the crutches.  Call if any problem.  Precautions discussed.   Electronically Signed Darreld Mclean, MD 9/26/20199:46 AM

## 2018-01-16 ENCOUNTER — Ambulatory Visit (INDEPENDENT_AMBULATORY_CARE_PROVIDER_SITE_OTHER): Payer: BLUE CROSS/BLUE SHIELD | Admitting: Orthopaedic Surgery

## 2018-01-16 ENCOUNTER — Encounter: Payer: Self-pay | Admitting: Orthopaedic Surgery

## 2018-01-16 DIAGNOSIS — S92344D Nondisplaced fracture of fourth metatarsal bone, right foot, subsequent encounter for fracture with routine healing: Secondary | ICD-10-CM

## 2018-01-16 DIAGNOSIS — S92324D Nondisplaced fracture of second metatarsal bone, right foot, subsequent encounter for fracture with routine healing: Secondary | ICD-10-CM

## 2018-01-16 DIAGNOSIS — S92214D Nondisplaced fracture of cuboid bone of right foot, subsequent encounter for fracture with routine healing: Secondary | ICD-10-CM

## 2018-01-16 DIAGNOSIS — S92334D Nondisplaced fracture of third metatarsal bone, right foot, subsequent encounter for fracture with routine healing: Secondary | ICD-10-CM

## 2018-01-16 MED ORDER — HYDROCODONE-ACETAMINOPHEN 7.5-325 MG PO TABS: ORAL_TABLET | ORAL | 0 refills | Status: DC

## 2018-01-16 NOTE — Progress Notes (Signed)
CC:  My swelling is down some,  It still hurts.  He has been in a posterior splint for multiple fractures of the right foot.  One of the blisters burst and drained.  He kept the splint and dressing intact.  He has been using the crutches.  He has no new trauma.  He has been taking his antibiotics.  NV intact.  He has less swelling and less redness of the right foot.  The blister between the second and great toe burst.  The other blisters are intact.  He has significant ecchymosis of the foot.   Encounter Diagnoses  Name Primary?  . Closed nondisplaced fracture of cuboid of right foot with routine healing, subsequent encounter Yes  . Closed nondisplaced fracture of second metatarsal bone of right foot with routine healing, subsequent encounter   . Closed nondisplaced fracture of third metatarsal bone of right foot with routine healing, subsequent encounter   . Closed nondisplaced fracture of fourth metatarsal bone of right foot with routine healing, subsequent encounter    New dressing was applied.  Splint reapplied.  I have reviewed the [place] Controlled Substance Reporting System web site prior to prescribing narcotic medicine for this patient.   Return in one week.  Call if any problem.  Precautions discussed.   Electronically Signed Darreld Mclean, MD 10/1/20199:49 AM

## 2018-01-23 ENCOUNTER — Encounter: Payer: Self-pay | Admitting: Orthopaedic Surgery

## 2018-01-23 ENCOUNTER — Ambulatory Visit (INDEPENDENT_AMBULATORY_CARE_PROVIDER_SITE_OTHER): Payer: BLUE CROSS/BLUE SHIELD | Admitting: Orthopaedic Surgery

## 2018-01-23 ENCOUNTER — Ambulatory Visit (HOSPITAL_COMMUNITY)
Admission: RE | Admit: 2018-01-23 | Discharge: 2018-01-23 | Disposition: A | Discharge status: Home / Self Care | Payer: BLUE CROSS/BLUE SHIELD | Source: Ambulatory Visit | Attending: Orthopaedic Surgery | Admitting: Orthopaedic Surgery

## 2018-01-23 DIAGNOSIS — X58XXXD Exposure to other specified factors, subsequent encounter: Secondary | ICD-10-CM | POA: Diagnosis not present

## 2018-01-23 DIAGNOSIS — S92214D Nondisplaced fracture of cuboid bone of right foot, subsequent encounter for fracture with routine healing: Secondary | ICD-10-CM

## 2018-01-23 DIAGNOSIS — S92324D Nondisplaced fracture of second metatarsal bone, right foot, subsequent encounter for fracture with routine healing: Secondary | ICD-10-CM | POA: Insufficient documentation

## 2018-01-23 DIAGNOSIS — S92344D Nondisplaced fracture of fourth metatarsal bone, right foot, subsequent encounter for fracture with routine healing: Secondary | ICD-10-CM | POA: Insufficient documentation

## 2018-01-23 NOTE — Progress Notes (Signed)
CC:  My foot is much better  His right foot has less swelling and pain.  The blisters have resolved.  His foot has much less ecchymosis.  He has no numbness.  NV intact.  He has no erythema now at all.  X-rays were done at the hospital.  Encounter Diagnosis  Name Primary?  . Closed nondisplaced fracture of cuboid of right foot with routine healing, subsequent encounter Yes  Closed fracture multiple metatarsals also.  I have told him about care of the foot.  A CAM walker is given with instructions.  I told him about doing exercises of his foot with marbles in a box on the floor.  Return in one week. No x-rays unless he has had a problem.  Call if any problem.  Precautions discussed.   Electronically Signed Darreld Mclean, MD 10/8/201910:51 AM

## 2018-01-24 ENCOUNTER — Telehealth: Payer: Self-pay | Admitting: Orthopaedic Surgery

## 2018-01-24 MED ORDER — HYDROCODONE-ACETAMINOPHEN 7.5-325 MG PO TABS: ORAL_TABLET | ORAL | 0 refills | Status: DC

## 2018-01-24 NOTE — Telephone Encounter (Signed)
Patient requests refill on Hydrocodone/Acetaminophen 7.5-325  [facility]   Qty  30       Sig: One tablet every four hours as needed for pain.   Patient states he uses [employer]

## 2018-01-30 ENCOUNTER — Ambulatory Visit (INDEPENDENT_AMBULATORY_CARE_PROVIDER_SITE_OTHER): Payer: BLUE CROSS/BLUE SHIELD | Admitting: Orthopaedic Surgery

## 2018-01-30 ENCOUNTER — Encounter: Payer: Self-pay | Admitting: Orthopaedic Surgery

## 2018-01-30 VITALS — BP 115/69 | HR 69 | Ht 68.0 in | Wt 130.0 lb

## 2018-01-30 DIAGNOSIS — S92324D Nondisplaced fracture of second metatarsal bone, right foot, subsequent encounter for fracture with routine healing: Secondary | ICD-10-CM

## 2018-01-30 DIAGNOSIS — S92334D Nondisplaced fracture of third metatarsal bone, right foot, subsequent encounter for fracture with routine healing: Secondary | ICD-10-CM

## 2018-01-30 DIAGNOSIS — S92344D Nondisplaced fracture of fourth metatarsal bone, right foot, subsequent encounter for fracture with routine healing: Secondary | ICD-10-CM

## 2018-01-30 DIAGNOSIS — S92214D Nondisplaced fracture of cuboid bone of right foot, subsequent encounter for fracture with routine healing: Secondary | ICD-10-CM

## 2018-01-30 MED ORDER — HYDROCODONE-ACETAMINOPHEN 7.5-325 MG PO TABS: ORAL_TABLET | ORAL | 0 refills | Status: DC

## 2018-01-30 NOTE — Patient Instructions (Signed)
He may go to work, no prolonged standing or walking.  Wear the CAM walker.

## 2018-01-30 NOTE — Progress Notes (Signed)
CC:  My foot is much better  He has no edema of the right foot now.  He has been using the CAM walker.  He has no redness.  NV intact. ROM of the toes is still decreased but they do not hurt now.    Encounter Diagnoses  Name Primary?  . Closed nondisplaced fracture of cuboid of right foot with routine healing, subsequent encounter Yes  . Closed nondisplaced fracture of second metatarsal bone of right foot with routine healing, subsequent encounter   . Closed nondisplaced fracture of third metatarsal bone of right foot with routine healing, subsequent encounter   . Closed nondisplaced fracture of fourth metatarsal bone of right foot with routine healing, subsequent encounter    He may go to work if no prolonged standing or walking.  He needs to use the CAM walker.  Return in three weeks.  X-rays of the right foot on return.  Call if any problem.  Precautions discussed.   Electronically Signed Darreld Mclean, MD 10/15/201910:39 AM

## 2018-02-20 ENCOUNTER — Ambulatory Visit (INDEPENDENT_AMBULATORY_CARE_PROVIDER_SITE_OTHER): Payer: BLUE CROSS/BLUE SHIELD

## 2018-02-20 ENCOUNTER — Encounter: Payer: Self-pay | Admitting: Orthopaedic Surgery

## 2018-02-20 ENCOUNTER — Ambulatory Visit (INDEPENDENT_AMBULATORY_CARE_PROVIDER_SITE_OTHER): Payer: BLUE CROSS/BLUE SHIELD | Admitting: Orthopaedic Surgery

## 2018-02-20 DIAGNOSIS — S92324D Nondisplaced fracture of second metatarsal bone, right foot, subsequent encounter for fracture with routine healing: Secondary | ICD-10-CM

## 2018-02-20 DIAGNOSIS — S92334D Nondisplaced fracture of third metatarsal bone, right foot, subsequent encounter for fracture with routine healing: Secondary | ICD-10-CM

## 2018-02-20 DIAGNOSIS — S92344D Nondisplaced fracture of fourth metatarsal bone, right foot, subsequent encounter for fracture with routine healing: Secondary | ICD-10-CM | POA: Diagnosis not present

## 2018-02-20 DIAGNOSIS — S92214D Nondisplaced fracture of cuboid bone of right foot, subsequent encounter for fracture with routine healing: Secondary | ICD-10-CM

## 2018-02-20 NOTE — Patient Instructions (Signed)
Full duty work 

## 2018-02-20 NOTE — Progress Notes (Signed)
CC:  I am ready to work  He has no pain with the right foot.  He still has the CAM walker but has been also using a regular shoe at home.  He has no problem.  NV intact.  He has no swelling.  X-rays were done of the right foot, reported separately.  Encounter Diagnoses  Name Primary?  . Closed nondisplaced fracture of cuboid of right foot with routine healing, subsequent encounter Yes  . Closed nondisplaced fracture of second metatarsal bone of right foot with routine healing, subsequent encounter   . Closed nondisplaced fracture of third metatarsal bone of right foot with routine healing, subsequent encounter   . Closed nondisplaced fracture of fourth metatarsal bone of right foot with routine healing, subsequent encounter    Go to work regular duty.  Return in two weeks.  No x-rays then.  Call if any problem.  Precautions discussed.   Electronically Signed Darreld Mclean, MD 11/5/201910:07 AM

## 2018-03-06 ENCOUNTER — Ambulatory Visit: Payer: BLUE CROSS/BLUE SHIELD | Admitting: Orthopaedic Surgery

## 2018-03-06 ENCOUNTER — Encounter: Payer: Self-pay | Admitting: Orthopaedic Surgery

## 2018-04-04 ENCOUNTER — Emergency Department (HOSPITAL_COMMUNITY)
Admission: EM | Admit: 2018-04-04 | Discharge: 2018-04-04 | Disposition: A | Discharge status: Home / Self Care | Payer: BLUE CROSS/BLUE SHIELD | Attending: Emergency Medicine | Admitting: Emergency Medicine

## 2018-04-04 ENCOUNTER — Encounter (HOSPITAL_COMMUNITY): Payer: Self-pay | Admitting: Emergency Medicine

## 2018-04-04 ENCOUNTER — Other Ambulatory Visit: Payer: Self-pay

## 2018-04-04 DIAGNOSIS — F17210 Nicotine dependence, cigarettes, uncomplicated: Secondary | ICD-10-CM | POA: Diagnosis not present

## 2018-04-04 DIAGNOSIS — J0100 Acute maxillary sinusitis, unspecified: Secondary | ICD-10-CM | POA: Diagnosis not present

## 2018-04-04 DIAGNOSIS — Z79899 Other long term (current) drug therapy: Secondary | ICD-10-CM | POA: Insufficient documentation

## 2018-04-04 DIAGNOSIS — R0981 Nasal congestion: Secondary | ICD-10-CM | POA: Diagnosis present

## 2018-04-04 MED ORDER — IBUPROFEN 800 MG PO TABS: 800.0000 mg | ORAL_TABLET | Freq: Three times a day (TID) | ORAL | 0 refills | Status: DC

## 2018-04-04 MED ORDER — MOMETASONE FUROATE 50 MCG/ACT NA SUSP: 2.0000 | Freq: Every day | NASAL | 12 refills | Status: AC

## 2018-04-04 MED ORDER — PSEUDOEPHEDRINE HCL 60 MG PO TABS
60.0000 mg | ORAL_TABLET | Freq: Once | ORAL | Status: AC
  Administered 2018-04-04: 60 mg via ORAL
  Filled 2018-04-04: qty 1

## 2018-04-04 MED ORDER — PSEUDOEPHEDRINE HCL 60 MG PO TABS: 60.0000 mg | ORAL_TABLET | Freq: Four times a day (QID) | ORAL | 0 refills | Status: AC | PRN

## 2018-04-04 MED ORDER — PREDNISONE 20 MG PO TABS
40.0000 mg | ORAL_TABLET | Freq: Once | ORAL | Status: AC
  Administered 2018-04-04: 40 mg via ORAL
  Filled 2018-04-04: qty 2

## 2018-04-04 NOTE — ED Triage Notes (Signed)
Patient reports nasal congestion and feeling ill for the past couple of days.

## 2018-04-04 NOTE — Discharge Instructions (Addendum)
Drink plenty of fluids while taking this medication.  Your symptoms should can to improve in a few days.  With your primary provider for recheck if needed

## 2018-04-06 NOTE — ED Provider Notes (Signed)
[facility] Provider Note   CSN: [id number] Arrival date & time: 04/04/18  2138     History   Chief Complaint Chief Complaint  Patient presents with  . URI    HPI Dale Benjamin is a 47 y.o. male.  HPI   Dale Benjamin is a 47 y.o. male who presents to the Emergency Department complaining of nasal congestion, cough, generalized body aches and malaise.  Symptoms present for 2-3 days.  Gradually worsening.  Cough described as non-productive and worse at night.  He also complains of nasal congestion and sinus pressure and pain.  He reports recurrent allergies and sinus issues.  He has been taking over-the-counter cold medication without relief.  He describes pain to his face with bending over.  He denies chest pain, shortness of breath, fever or chills.  He states his current symptoms feel similar to previous sinus infections.  He denies dizziness, headache, neck pain or stiffness    Past Medical History:  Diagnosis Date  . Back pain   . Seasonal allergies     Patient Active Problem List   Diagnosis Date Noted  . 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 SBO (small bowel obstruction) (HCC) 04/13/2016  . Right inguinal hernia 04/13/2016    Past Surgical History:  Procedure Laterality Date  . HERNIA REPAIR Right 2008  . INGUINAL HERNIA REPAIR Right 04/15/2016   Procedure: HERNIA REPAIR INGUINAL INCARCERATED;  Surgeon: Franky MachoMark Jenkins, MD;  Location: [facility];  Service: General;  Laterality: Right;     Home Medications    Prior to Admission medications   Medication Sig Start Date End Date Taking? Authorizing Provider  ibuprofen (ADVIL,MOTRIN) 800 MG tablet Take 1 tablet (800 mg total) by mouth 3 (three) times daily. 04/04/18   Carlosdaniel Grob, PA-C  mometasone (NASONEX) 50 MCG/ACT nasal spray Place 2 sprays into the nose daily. 04/04/18   Daneil Beem, PA-C  pseudoephedrine (SUDAFED) 60 MG tablet Take 1 tablet (60 mg  total) by mouth every 6 (six) hours as needed for congestion. 04/04/18   Pauline Ausriplett, Temara Lanum, PA-C    Family History Family History  Problem Relation Age of Onset  . Allergies Mother     Social History Social History   Tobacco Use  . Smoking status: Current Every Day Smoker    Packs/day: 0.50    Types: Cigarettes  . Smokeless tobacco: Never Used  Substance Use Topics  . Alcohol use: Yes    Alcohol/week: 3.0 standard drinks    Types: 3 Cans of beer per week    Comment: occ.  . Drug use: No     Allergies   Patient has no known allergies.   Review of Systems Review of Systems  Constitutional: Negative for activity change, appetite change, chills and fever.  HENT: Positive for congestion, rhinorrhea, sinus pressure and sinus pain. Negative for facial swelling, sore throat and trouble swallowing.   Eyes: Negative for visual disturbance.  Respiratory: Positive for cough. Negative for shortness of breath, wheezing and stridor.   Cardiovascular: Negative for chest pain.  Gastrointestinal: Negative for abdominal pain, nausea and vomiting.  Musculoskeletal: Negative for neck pain and neck stiffness.  Skin: Negative for rash.  Neurological: Negative for dizziness, speech difficulty, weakness, numbness and headaches.  Hematological: Negative for adenopathy.  Psychiatric/Behavioral: Negative for confusion.     Physical Exam Updated Vital Signs BP 116/76   Pulse 71   Temp 97.7 F (36.5 C) (Oral)   Resp  20   Ht 5\' 8"  (1.727 m)   Wt 59 kg   SpO2 100%   BMI 19.77 kg/m   Physical Exam Vitals signs and nursing note reviewed.  Constitutional:      Appearance: Normal appearance. He is not ill-appearing.  HENT:     Head: Atraumatic.     Right Ear: Tympanic membrane and ear canal normal.     Left Ear: Tympanic membrane and ear canal normal.     Nose:     Right Turbinates: Swollen. Not enlarged.     Left Turbinates: Swollen. Not enlarged.     Right Sinus: Maxillary sinus  tenderness present.     Left Sinus: Maxillary sinus tenderness present.     Mouth/Throat:     Mouth: Mucous membranes are moist.     Pharynx: Oropharynx is clear. Uvula midline. No oropharyngeal exudate, posterior oropharyngeal erythema or uvula swelling.  Neck:     Musculoskeletal: Normal range of motion. No neck rigidity.  Cardiovascular:     Rate and Rhythm: Normal rate and regular rhythm.     Pulses: Normal pulses.  Pulmonary:     Effort: Pulmonary effort is normal.     Breath sounds: Normal breath sounds.  Musculoskeletal: Normal range of motion.  Lymphadenopathy:     Cervical: No cervical adenopathy.  Skin:    General: Skin is warm.     Findings: No rash.  Neurological:     General: No focal deficit present.     Mental Status: He is alert. Mental status is at baseline.     Sensory: No sensory deficit.  Psychiatric:        Mood and Affect: Mood normal.      ED Treatments / Results  Labs (all labs ordered are listed, but only abnormal results are displayed) Labs Reviewed - No data to display  EKG None  Radiology No results found.  Procedures Procedures (including critical care time)  Medications Ordered in ED Medications  predniSONE (DELTASONE) tablet 40 mg (40 mg Oral Given 04/04/18 2232)  pseudoephedrine (SUDAFED) tablet 60 mg (60 mg Oral Given 04/04/18 2232)     Initial Impression / Assessment and Plan / ED Course  I have reviewed the triage vital signs and the nursing notes.  Pertinent labs & imaging results that were available during my care of the patient were reviewed by me and considered in my medical decision making (see chart for details).     Patient is well-appearing.  Nontoxic.   vital signs reassuring.  Airway is patent no edema.  Symptoms are likely related to recurrent sinusitis.  Patient agrees to treatment plan with steroid nasal spray, ibuprofen, and decongestant, no history of hypertension.  Final Clinical Impressions(s) / ED  Diagnoses   Final diagnoses:  Acute maxillary sinusitis, recurrence not specified    ED Discharge Orders         Ordered    mometasone (NASONEX) 50 MCG/ACT nasal spray  Daily     04/04/18 2222    pseudoephedrine (SUDAFED) 60 MG tablet  Every 6 hours PRN     04/04/18 2222    ibuprofen (ADVIL,MOTRIN) 800 MG tablet  3 times daily     04/04/18 2222           Pauline Ausriplett, Kylii Ennis, PA-C 04/06/18 2155    Bethann BerkshireZammit, Joseph, MD 04/06/18 2316

## 2018-04-22 ENCOUNTER — Encounter (HOSPITAL_COMMUNITY): Payer: Self-pay | Admitting: *Deleted

## 2018-04-22 ENCOUNTER — Other Ambulatory Visit: Payer: Self-pay

## 2018-04-22 ENCOUNTER — Emergency Department (HOSPITAL_COMMUNITY)
Admission: EM | Admit: 2018-04-22 | Discharge: 2018-04-22 | Disposition: A | Discharge status: Home / Self Care | Payer: BLUE CROSS/BLUE SHIELD | Attending: Emergency Medicine | Admitting: Emergency Medicine

## 2018-04-22 DIAGNOSIS — L02415 Cutaneous abscess of right lower limb: Secondary | ICD-10-CM | POA: Insufficient documentation

## 2018-04-22 DIAGNOSIS — L02215 Cutaneous abscess of perineum: Secondary | ICD-10-CM

## 2018-04-22 DIAGNOSIS — F17210 Nicotine dependence, cigarettes, uncomplicated: Secondary | ICD-10-CM | POA: Insufficient documentation

## 2018-04-22 LAB — CBG MONITORING, ED: GLUCOSE-CAPILLARY: 86 mg/dL (ref 70–99)

## 2018-04-22 MED ORDER — DOXYCYCLINE HYCLATE 100 MG PO CAPS: 100.0000 mg | ORAL_CAPSULE | Freq: Two times a day (BID) | ORAL | 0 refills | Status: DC

## 2018-04-22 MED ORDER — HYDROCODONE-ACETAMINOPHEN 5-325 MG PO TABS
1.0000 | ORAL_TABLET | Freq: Once | ORAL | Status: AC
  Administered 2018-04-22: 1 via ORAL
  Filled 2018-04-22: qty 1

## 2018-04-22 MED ORDER — HYDROCODONE-ACETAMINOPHEN 5-325 MG PO TABS: ORAL_TABLET | ORAL | 0 refills | Status: DC

## 2018-04-22 MED ORDER — DOXYCYCLINE HYCLATE 100 MG PO TABS
100.0000 mg | ORAL_TABLET | Freq: Once | ORAL | Status: AC
  Administered 2018-04-22: 100 mg via ORAL
  Filled 2018-04-22: qty 1

## 2018-04-22 NOTE — ED Notes (Signed)
Pt reports abscess to "my privates" for the last three days  Started small now bigger and he is here for eval

## 2018-04-22 NOTE — ED Triage Notes (Signed)
Pt c/o abscess to leg and testicle region that started a few days ago, pt reports that the area on his leg has "busted"

## 2018-04-22 NOTE — ED Notes (Signed)
TT in to evaluate 

## 2018-04-22 NOTE — Discharge Instructions (Addendum)
As discussed, frequent warm wet soaks or compresses to the affected areas.  Clean with antibacterial soap.  Take the antibiotic as directed until its finished.  Return here for any worsening symptoms such as increasing pain, fever or increasing redness

## 2018-04-22 NOTE — ED Notes (Signed)
CBG 86. 

## 2018-04-22 NOTE — ED Provider Notes (Signed)
[facility] Provider Note   CSN: [id number] Arrival date & time: 04/22/18  2102     History   Chief Complaint Chief Complaint  Patient presents with  . Abscess    HPI Dale Benjamin is a 48 y.o. male.  HPI   Dale Benjamin is a 48 y.o. male who presents to the Emergency Department complaining of pain and "boil" to his posterior right thigh and perineum.  He reports symptoms began 2-3 days ago and both areas have been draining pus for one day.  Pain is associated with palpation and sitting.  He reports hx of same.  He has been soaking in warm water with some relief.  He denies abdominal pain, swelling, difficulty urinating or defecating, fever or chills.     Past Medical History:  Diagnosis Date  . Back pain   . Seasonal allergies     Patient Active Problem List   Diagnosis Date Noted  . 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 SBO (small bowel obstruction) (HCC) 04/13/2016  . Right inguinal hernia 04/13/2016    Past Surgical History:  Procedure Laterality Date  . HERNIA REPAIR Right 2008  . INGUINAL HERNIA REPAIR Right 04/15/2016   Procedure: HERNIA REPAIR INGUINAL INCARCERATED;  Surgeon: Franky MachoMark Jenkins, MD;  Location: [facility];  Service: General;  Laterality: Right;      Home Medications    Prior to Admission medications   Medication Sig Start Date End Date Taking? Authorizing Provider  ibuprofen (ADVIL,MOTRIN) 800 MG tablet Take 1 tablet (800 mg total) by mouth 3 (three) times daily. 04/04/18   Julita Ozbun, PA-C  mometasone (NASONEX) 50 MCG/ACT nasal spray Place 2 sprays into the nose daily. 04/04/18   Kash Davie, PA-C  pseudoephedrine (SUDAFED) 60 MG tablet Take 1 tablet (60 mg total) by mouth every 6 (six) hours as needed for congestion. 04/04/18   Pauline Ausriplett, Sapphira Harjo, PA-C    Family History Family History  Problem Relation Age of Onset  . Allergies Mother     Social History Social History    Tobacco Use  . Smoking status: Current Every Day Smoker    Packs/day: 0.50    Types: Cigarettes  . Smokeless tobacco: Never Used  Substance Use Topics  . Alcohol use: Yes    Alcohol/week: 3.0 standard drinks    Types: 3 Cans of beer per week    Comment: occ.  . Drug use: No     Allergies   Patient has no known allergies.   Review of Systems Review of Systems  Constitutional: Negative for chills and fever.  Gastrointestinal: Negative for nausea and vomiting.  Musculoskeletal: Negative for arthralgias and joint swelling.  Skin: Positive for color change.       Abscess   Hematological: Negative for adenopathy.  All other systems reviewed and are negative.    Physical Exam Updated Vital Signs BP (!) 150/91 (BP Location: Right Arm)   Pulse 86   Temp 98 F (36.7 C) (Oral)   Resp 18   Ht 5\' 8"  (1.727 m)   Wt 59 kg   SpO2 99%   BMI 19.77 kg/m   Physical Exam Vitals signs and nursing note reviewed.  Constitutional:      General: He is not in acute distress.    Appearance: He is well-developed. He is not ill-appearing or toxic-appearing.  HENT:     Head: Atraumatic.     Mouth/Throat:     Mouth: Mucous membranes  are moist.     Pharynx: Oropharynx is clear.  Cardiovascular:     Rate and Rhythm: Normal rate and regular rhythm.     Heart sounds: Normal heart sounds. No murmur.  Pulmonary:     Effort: Pulmonary effort is normal. No respiratory distress.     Breath sounds: Normal breath sounds.  Abdominal:     General: There is no distension.     Palpations: Abdomen is soft.     Tenderness: There is no abdominal tenderness.  Genitourinary:    Penis: Circumcised. No erythema, tenderness or swelling.      Scrotum/Testes: Normal. Cremasteric reflex is present.        Right: Mass, tenderness or swelling not present.        Left: Mass, tenderness or swelling not present.     Epididymis:     Right: No mass or tenderness.     Left: No mass or tenderness.   Musculoskeletal: Normal range of motion.  Skin:    General: Skin is warm and dry.     Capillary Refill: Capillary refill takes less than 2 seconds.     Findings: Erythema present.     Comments: Open ulcerated area to the posterior right upper thigh with mild to moderate purulent drainage, and several small satellite pustules.  He also has a 3 cm area of tenderness and fluctuance to the perineum that is currently open and draining a significant amt of purulent material. no surrounding erythema or crepitus of the soft tissues  Neurological:     General: No focal deficit present.     Mental Status: He is alert.     Sensory: No sensory deficit.     Motor: No weakness or abnormal muscle tone.     Gait: Gait normal.     ED Treatments / Results  Labs (all labs ordered are listed, but only abnormal results are displayed) Labs Reviewed  CBG MONITORING, ED    EKG None  Radiology No results found.  Procedures Procedures (including critical care time)  Medications Ordered in ED Medications - No data to display   Initial Impression / Assessment and Plan / ED Course  I have reviewed the triage vital signs and the nursing notes.  Pertinent labs & imaging results that were available during my care of the patient were reviewed by me and considered in my medical decision making (see chart for details).     CBG 86  Pt seen by Dr. Adriana Simas as well.  Care plan discussed.  No indication for I&D   Pt well appearing.  Non-toxic.  Ambulates with a steady gait.  He agrees to proper hygiene, warm water soaks and abx.  Discussed strict return precautions.    Final Clinical Impressions(s) / ED Diagnoses   Final diagnoses:  Abscess of right thigh  Abscess of perineum    ED Discharge Orders    None       Rosey Bath 04/23/18 0001    Donnetta Hutching, MD 04/27/18 1950    Donnetta Hutching, MD 04/27/18 [phone]

## 2018-05-18 ENCOUNTER — Emergency Department (HOSPITAL_COMMUNITY)
Admission: EM | Admit: 2018-05-18 | Discharge: 2018-05-18 | Disposition: A | Discharge status: Home / Self Care | Payer: Self-pay | Attending: Emergency Medicine | Admitting: Emergency Medicine

## 2018-05-18 ENCOUNTER — Encounter (HOSPITAL_COMMUNITY): Payer: Self-pay | Admitting: Emergency Medicine

## 2018-05-18 ENCOUNTER — Other Ambulatory Visit: Payer: Self-pay

## 2018-05-18 DIAGNOSIS — F17210 Nicotine dependence, cigarettes, uncomplicated: Secondary | ICD-10-CM | POA: Insufficient documentation

## 2018-05-18 DIAGNOSIS — B349 Viral infection, unspecified: Secondary | ICD-10-CM | POA: Insufficient documentation

## 2018-05-18 DIAGNOSIS — Z79899 Other long term (current) drug therapy: Secondary | ICD-10-CM | POA: Insufficient documentation

## 2018-05-18 NOTE — ED Notes (Signed)
Pt c/o dry non productive cough that started two days ago with sinus congestion, denies any fever, states that the cough is worse at night,

## 2018-05-18 NOTE — ED Provider Notes (Signed)
[facility] Provider [facility]   CSN: [id number] Arrival date & time: 05/18/18  2130     History   Chief Complaint Chief Complaint  Patient presents with  . Nasal Congestion    HPI Dale Benjamin is a 48 y.o. male.  Patient is a 48 year old male who presents to the emergency department with a complaint of nasal congestion and nonproductive cough.  Patient states that this is been going on over the last 2 days.  He says the symptoms seem to be getting worse.  It is accompanied by body aching.  No nausea, vomiting, or diarrhea reported.  No hemoptysis reported.  No high fever reported.  The patient has not been out of the country recently.  He presents now for evaluation concerning this illness.  The history is provided by the patient.    Past Medical History:  Diagnosis Date  . Back pain   . Seasonal allergies     Patient Active Problem List   Diagnosis Date Noted  . 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 SBO (small bowel obstruction) (HCC) 04/13/2016  . Right inguinal hernia 04/13/2016    Past Surgical History:  Procedure Laterality Date  . HERNIA REPAIR Right 2008  . INGUINAL HERNIA REPAIR Right 04/15/2016   Procedure: HERNIA REPAIR INGUINAL INCARCERATED;  Surgeon: Franky MachoMark Jenkins, MD;  Location: [facility];  Service: General;  Laterality: Right;        Home Medications    Prior to Admission medications   Medication Sig Start Date End Date Taking? Authorizing Provider  doxycycline (VIBRAMYCIN) 100 MG capsule Take 1 capsule (100 mg total) by mouth 2 (two) times daily. 04/22/18   Triplett, Tammy, PA-C  HYDROcodone-acetaminophen (NORCO/VICODIN) 5-325 MG tablet Take one tab po q 4 hrs prn pain 04/22/18   Triplett, Tammy, PA-C  ibuprofen (ADVIL,MOTRIN) 800 MG tablet Take 1 tablet (800 mg total) by mouth 3 (three) times daily. 04/04/18   Triplett, Tammy, PA-C  mometasone (NASONEX) 50 MCG/ACT nasal spray Place 2 sprays into the  nose daily. 04/04/18   Triplett, Tammy, PA-C  pseudoephedrine (SUDAFED) 60 MG tablet Take 1 tablet (60 mg total) by mouth every 6 (six) hours as needed for congestion. 04/04/18   Pauline Ausriplett, Tammy, PA-C    Family History Family History  Problem Relation Age of Onset  . Allergies Mother     Social History Social History   Tobacco Use  . Smoking status: Current Every Day Smoker    Packs/day: 0.50    Types: Cigarettes  . Smokeless tobacco: Never Used  Substance Use Topics  . Alcohol use: Yes    Alcohol/week: 3.0 standard drinks    Types: 3 Cans of beer per week    Comment: occ.  . Drug use: No     Allergies   Patient has no known allergies.   Review of Systems Review of Systems  Constitutional: Positive for appetite change. Negative for activity change.       All ROS Neg except as noted in HPI  HENT: Positive for congestion. Negative for nosebleeds.   Eyes: Negative for photophobia and discharge.  Respiratory: Positive for cough. Negative for shortness of breath and wheezing.   Cardiovascular: Negative for chest pain and palpitations.  Gastrointestinal: Negative for abdominal pain and blood in stool.  Genitourinary: Negative for dysuria, frequency and hematuria.  Musculoskeletal: Positive for myalgias. Negative for arthralgias, back pain and neck pain.  Skin: Negative.   Neurological: Negative for dizziness,  seizures and speech difficulty.  Psychiatric/Behavioral: Negative for confusion and hallucinations.     Physical Exam Updated Vital Signs BP 121/74 (BP Location: Right Arm)   Pulse 77   Temp 97.7 F (36.5 C) (Oral)   Resp 16   Ht 5\' 8"  (1.727 m)   Wt 59 kg   SpO2 99%   BMI 19.77 kg/m   Physical Exam Vitals signs and nursing note reviewed.  Constitutional:      Appearance: He is well-developed. He is not toxic-appearing.  HENT:     Head: Normocephalic.     Right Ear: Tympanic membrane and external ear normal.     Left Ear: Tympanic membrane and  external ear normal.     Nose: Congestion present.  Eyes:     General: Lids are normal.     Pupils: Pupils are equal, round, and reactive to light.  Neck:     Musculoskeletal: Normal range of motion and neck supple.     Vascular: No carotid bruit.  Cardiovascular:     Rate and Rhythm: Normal rate and regular rhythm.     Pulses: Normal pulses.     Heart sounds: Normal heart sounds.  Pulmonary:     Effort: No respiratory distress.     Breath sounds: Normal breath sounds.  Abdominal:     General: Bowel sounds are normal.     Palpations: Abdomen is soft.     Tenderness: There is no abdominal tenderness. There is no guarding.  Musculoskeletal: Normal range of motion.  Lymphadenopathy:     Head:     Right side of head: No submandibular adenopathy.     Left side of head: No submandibular adenopathy.     Cervical: No cervical adenopathy.  Skin:    General: Skin is warm and dry.  Neurological:     Mental Status: He is alert and oriented to person, place, and time.     Cranial Nerves: No cranial nerve deficit.     Sensory: No sensory deficit.  Psychiatric:        Speech: Speech normal.      ED Treatments / Results  Labs (all labs ordered are listed, but only abnormal results are displayed) Labs Reviewed - No data to display  EKG None  Radiology No results found.  Procedures Procedures (including critical care time)  Medications Ordered in ED Medications - No data to display   Initial Impression / Assessment and Plan / ED Course  I have reviewed the triage vital signs and the nursing notes.  Pertinent labs & imaging results that were available during my care of the patient were reviewed by me and considered in my medical decision making (see chart for details).       Final Clinical Impressions(s) / ED Diagnoses MDM  Vital signs within normal limits.  Pulse oximetry is 99% on room air.  Within normal limits by my interpretation.  The examination favors an  upper respiratory infection.  The patient is in no acute distress at this time.  I have asked the patient to wash hands frequently and increase fluids.  We discussed the contagious nature of this illness.  Patient is to follow-up with his primary physician or return to the emergency department if any changes in condition, problems, and concerns.   Final diagnoses:  Viral illness    ED Discharge Orders    None       Ivery Quale, Cordelia Poche 05/18/18 2352    Benjiman Core, MD 05/21/18 [phone]

## 2018-05-18 NOTE — Discharge Instructions (Addendum)
Please use your mask until symptoms have resolved.  Please wash hands frequently.  Please increase fluids.  Use Tylenol every 4 hours or ibuprofen every 6 hours for aching.  Use the decongesting medication of your choice.  See your primary physician or return to the emergency department if any changes in your condition, problems, or concerns.

## 2018-05-18 NOTE — ED Triage Notes (Signed)
Body achese with nasal sinus congestion per pt for two days.

## 2018-12-21 ENCOUNTER — Other Ambulatory Visit: Payer: Self-pay

## 2018-12-21 ENCOUNTER — Encounter (HOSPITAL_COMMUNITY): Payer: Self-pay

## 2018-12-21 ENCOUNTER — Emergency Department (HOSPITAL_COMMUNITY)
Admission: EM | Admit: 2018-12-21 | Discharge: 2018-12-21 | Disposition: A | Discharge status: Home / Self Care | Payer: Self-pay | Attending: Emergency Medicine | Admitting: Emergency Medicine

## 2018-12-21 DIAGNOSIS — F17210 Nicotine dependence, cigarettes, uncomplicated: Secondary | ICD-10-CM | POA: Insufficient documentation

## 2018-12-21 DIAGNOSIS — Z79899 Other long term (current) drug therapy: Secondary | ICD-10-CM | POA: Insufficient documentation

## 2018-12-21 DIAGNOSIS — L02411 Cutaneous abscess of right axilla: Secondary | ICD-10-CM | POA: Insufficient documentation

## 2018-12-21 MED ORDER — TRAMADOL HCL 50 MG PO TABS: 50.0000 mg | ORAL_TABLET | Freq: Four times a day (QID) | ORAL | 0 refills | Status: DC | PRN

## 2018-12-21 MED ORDER — LIDOCAINE HCL (PF) 1 % IJ SOLN
INTRAMUSCULAR | Status: AC
  Administered 2018-12-21: 11:00:00
  Filled 2018-12-21: qty 2

## 2018-12-21 MED ORDER — HYDROCODONE-ACETAMINOPHEN 5-325 MG PO TABS
2.0000 | ORAL_TABLET | Freq: Once | ORAL | Status: AC
  Administered 2018-12-21: 10:00:00 2 via ORAL
  Filled 2018-12-21: qty 2

## 2018-12-21 MED ORDER — DOXYCYCLINE HYCLATE 100 MG PO CAPS: 100.0000 mg | ORAL_CAPSULE | Freq: Two times a day (BID) | ORAL | 0 refills | Status: DC

## 2018-12-21 MED ORDER — DOXYCYCLINE HYCLATE 100 MG PO TABS
100.0000 mg | ORAL_TABLET | Freq: Once | ORAL | Status: AC
  Administered 2018-12-21: 100 mg via ORAL
  Filled 2018-12-21: qty 1

## 2018-12-21 MED ORDER — CEFTRIAXONE SODIUM 1 G IJ SOLR
1.0000 g | Freq: Once | INTRAMUSCULAR | Status: AC
  Administered 2018-12-21: 10:00:00 1 g via INTRAMUSCULAR
  Filled 2018-12-21: qty 10

## 2018-12-21 MED ORDER — ONDANSETRON HCL 4 MG PO TABS
4.0000 mg | ORAL_TABLET | Freq: Once | ORAL | Status: AC
  Administered 2018-12-21: 4 mg via ORAL
  Filled 2018-12-21: qty 1

## 2018-12-21 MED ORDER — IBUPROFEN 600 MG PO TABS: 600.0000 mg | ORAL_TABLET | Freq: Four times a day (QID) | ORAL | 0 refills | Status: AC

## 2018-12-21 NOTE — ED Triage Notes (Signed)
Pt reports multiple abscesses under r arm x 4 days.

## 2018-12-21 NOTE — Discharge Instructions (Addendum)
Please soak in a tub of warm [place] salt water for about 15 minutes daily.  Please use doxycycline 2 times daily with food until all taken.  Please see Dr. Constance Haw for surgical evaluation of the multiple abscess areas under your arm.

## 2018-12-21 NOTE — ED Provider Notes (Signed)
[facility] EMERGENCY DEPARTMENT Provider Note   CSN: [id number] Arrival date & time: 12/21/18  0846     History   Chief Complaint Chief Complaint  Patient presents with  . Abscess    HPI Dale Benjamin is a 48 y.o. male.     Patient is a 48 year old male who presents to the emergency department with multiple abscesses.  Patient says he has had problems with abscesses from time to time.  This particular episode he has noted several abscess areas under the right arm.  This is been going on for about 4 days, and it seems to be getting worse.  The patient has not had any fever or chills with the reported.  The areas are not draining.  The patient does not recall a diagnosis of MRSA/ methicillin-resistant staph.  He has not had any recent operations or procedures that involved the axilla.  No nausea or vomiting reported.  No injury to the arm or to the axilla area.  He presents now for assistance with this issue     Past Medical History:  Diagnosis Date  . Back pain   . Seasonal allergies     Patient Active Problem List   Diagnosis Date Noted  . 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 SBO (small bowel obstruction) ([place]) 04/13/2016  . Right inguinal hernia 04/13/2016    Past Surgical History:  Procedure Laterality Date  . HERNIA REPAIR Right 2008  . INGUINAL HERNIA REPAIR Right 04/15/2016   Procedure: HERNIA REPAIR INGUINAL INCARCERATED;  Surgeon: Aviva Signs, MD;  Location: [facility];  Service: General;  Laterality: Right;        Home Medications    Prior to Admission medications   Medication Sig Start Date End Date Taking? Authorizing Provider  doxycycline (VIBRAMYCIN) 100 MG capsule Take 1 capsule (100 mg total) by mouth 2 (two) times daily. 04/22/18   Triplett, Tammy, PA-C  HYDROcodone-acetaminophen (NORCO/VICODIN) 5-325 MG tablet Take one tab po q 4 hrs prn pain 04/22/18   Triplett, Tammy, PA-C  ibuprofen (ADVIL,MOTRIN) 800 MG tablet  Take 1 tablet (800 mg total) by mouth 3 (three) times daily. 04/04/18   Triplett, Tammy, PA-C  mometasone (NASONEX) 50 MCG/ACT nasal spray Place 2 sprays into the nose daily. 04/04/18   Triplett, Tammy, PA-C  pseudoephedrine (SUDAFED) 60 MG tablet Take 1 tablet (60 mg total) by mouth every 6 (six) hours as needed for congestion. 04/04/18   Kem Parkinson, PA-C    Family History Family History  Problem Relation Age of Onset  . Allergies Mother     Social History Social History   Tobacco Use  . Smoking status: Current Every Day Smoker    Packs/day: 0.50    Types: Cigarettes  . Smokeless tobacco: Never Used  Substance Use Topics  . Alcohol use: Yes    Alcohol/week: 3.0 standard drinks    Types: 3 Cans of beer per week    Comment: occ.  . Drug use: No     Allergies   Patient has no known allergies.   Review of Systems Review of Systems  Constitutional: Negative for activity change and appetite change.  HENT: Negative for congestion, ear discharge, ear pain, facial swelling, nosebleeds, rhinorrhea, sneezing and tinnitus.   Eyes: Negative for photophobia, pain and discharge.  Respiratory: Negative for cough, choking, shortness of breath and wheezing.   Cardiovascular: Negative for chest pain, palpitations and leg swelling.  Gastrointestinal: Negative for abdominal pain, blood in  stool, constipation, diarrhea, nausea and vomiting.  Genitourinary: Negative for difficulty urinating, dysuria, flank pain, frequency and hematuria.  Musculoskeletal: Negative for back pain, gait problem, myalgias and neck pain.  Skin: Negative for color change, rash and wound.       Skin abscesses  Neurological: Negative for dizziness, seizures, syncope, facial asymmetry, speech difficulty, weakness and numbness.  Hematological: Negative for adenopathy. Does not bruise/bleed easily.  Psychiatric/Behavioral: Negative for agitation, confusion, hallucinations, self-injury and suicidal ideas. The patient  is not nervous/anxious.      Physical Exam Updated Vital Signs BP 103/64 (BP Location: Left Arm)   Pulse 60   Temp 98.2 F (36.8 C) (Oral)   Resp 18   Ht 5\' 8"  (1.727 m)   Wt 59 kg   SpO2 100%   BMI 19.77 kg/m   Physical Exam Vitals signs and nursing note reviewed.  Constitutional:      Appearance: He is well-developed. He is not toxic-appearing.  HENT:     Head: Normocephalic.     Right Ear: Tympanic membrane and external ear normal.     Left Ear: Tympanic membrane and external ear normal.  Eyes:     General: Lids are normal.     Pupils: Pupils are equal, round, and reactive to light.  Neck:     Musculoskeletal: Normal range of motion and neck supple.     Vascular: No carotid bruit.  Cardiovascular:     Rate and Rhythm: Normal rate and regular rhythm.     Pulses: Normal pulses.     Heart sounds: Normal heart sounds.  Pulmonary:     Effort: No respiratory distress.     Breath sounds: Normal breath sounds.  Abdominal:     General: Bowel sounds are normal.     Palpations: Abdomen is soft.     Tenderness: There is no abdominal tenderness. There is no guarding.  Musculoskeletal: Normal range of motion.     Comments: There are 5 small red raised abscess areas of the right axilla.  There is no red streak noted.  There is no drainage appreciated.  The area is warm, but not hot.  The areas are painful to touch.  Lymphadenopathy:     Head:     Right side of head: No submandibular adenopathy.     Left side of head: No submandibular adenopathy.     Cervical: No cervical adenopathy.  Skin:    General: Skin is warm and dry.     Findings: Abscess present.     Comments: There are 5 small red raised abscess areas of the right axilla.  There is no red streaking appreciated.  There is no drainage noted.  The area is warm, but not hot.  The areas are quite painful to palpation.  Neurological:     Mental Status: He is alert and oriented to person, place, and time.     Cranial  Nerves: No cranial nerve deficit.     Sensory: No sensory deficit.  Psychiatric:        Speech: Speech normal.      ED Treatments / Results  Labs (all labs ordered are listed, but only abnormal results are displayed) Labs Reviewed - No data to display  EKG None  Radiology No results found.  Procedures Procedures (including critical care time)  Medications Ordered in ED Medications - No data to display   Initial Impression / Assessment and Plan / ED Course  I have reviewed the triage vital signs and  the nursing notes.  Pertinent labs & imaging results that were available during my care of the patient were reviewed by me and considered in my medical decision making (see chart for details).         Final Clinical Impressions(s) / ED Diagnoses MDM  Vital signs reviewed.  Pulse oximetry is 100% on room air.  Within normal limits by my interpretation.  The patient has an area with multiple abscess areas of the right axilla.  There is no red streaking appreciated.  No drainage noted.  No temperature elevation noted.  There are no neurovascular deficits of the upper extremity.  I discussed with the patient the need for warm Epson salt soaks.  Patient has been placed on antibiotic management.  He is also been given medication to use for pain.  Patient is referred to general surgery for surgical consultation concerning this issue, as this may be a developing hidradenitis.  Patient is in agreement with this plan.   Final diagnoses:  Abscess of axilla, right    ED Discharge Orders         Ordered    doxycycline (VIBRAMYCIN) 100 MG capsule  2 times daily     12/21/18 1003    traMADol (ULTRAM) 50 MG tablet  Every 6 hours PRN,   Status:  Discontinued     12/21/18 1003    ibuprofen (ADVIL) 600 MG tablet  4 times daily     12/21/18 1003    traMADol (ULTRAM) 50 MG tablet  Every 6 hours PRN     12/21/18 1005           Ivery Quale, PA-C 12/22/18 1941    Samuel Jester, DO 12/24/18 1720

## 2019-04-18 IMAGING — CT CT FOOT*R* W/O CM
3 of 4 series · 10 of 34 positions shown, 12 images · non-contrast
Comparison: 01/09/2018 radiographs.

CLINICAL DATA: Foot trauma, Lisfranc fracture suspected

EXAM:
CT OF THE RIGHT FOOT WITHOUT CONTRAST
TECHNIQUE: Multidetector CT imaging of the right foot was performed according
to the standard protocol. Multiplanar CT image reconstructions were
also generated.

[Series 5: axial bone · coronal · 0.36mm/px · 3 of 275 slices shown]
[im 55/275  bone]
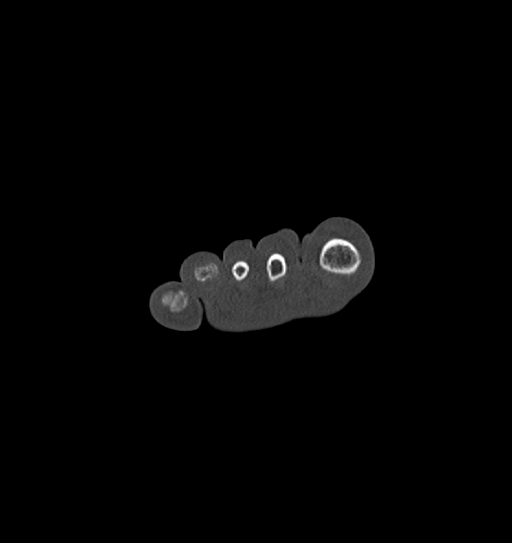
[im 98/275  bone]
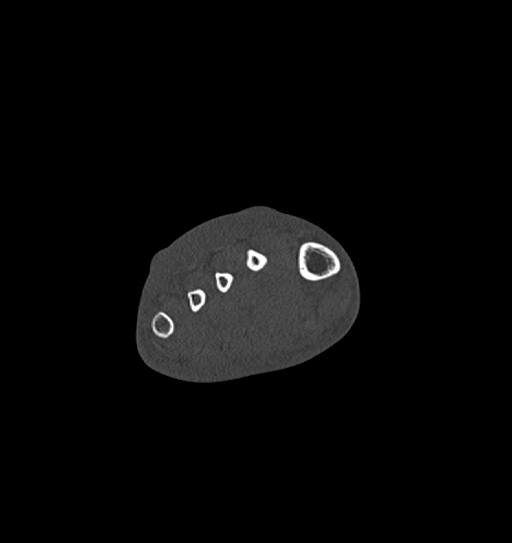
[im 142/275  bone]
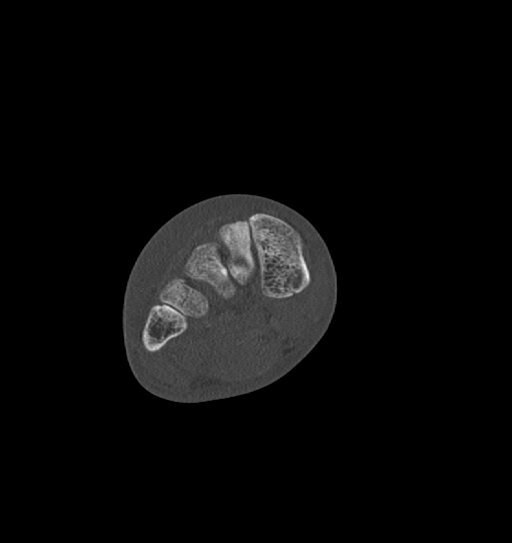

[Series 6: cor bone · axial · 0.29mm/px · z∈[-26,+30]mm · 2 of 153 slices shown, 3 images]
[im 47/153  soft-tissue]
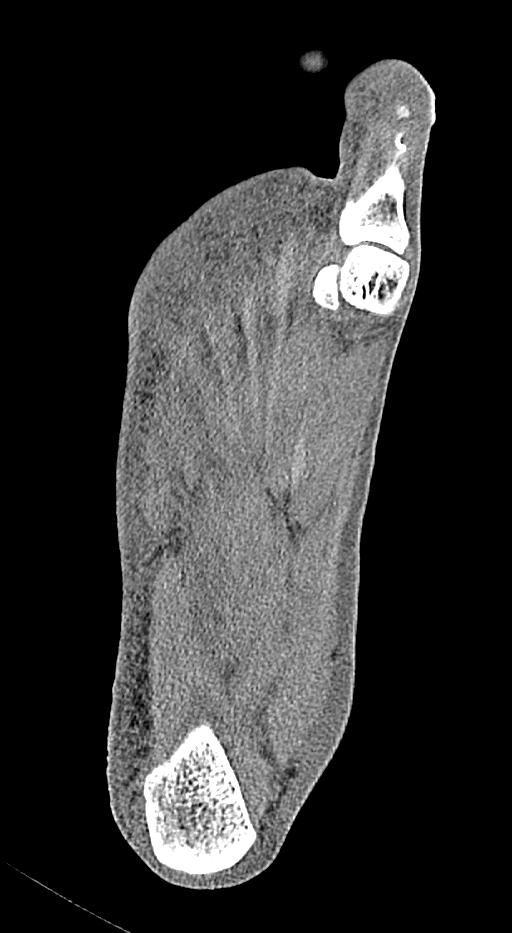
[im 47/153  bone]
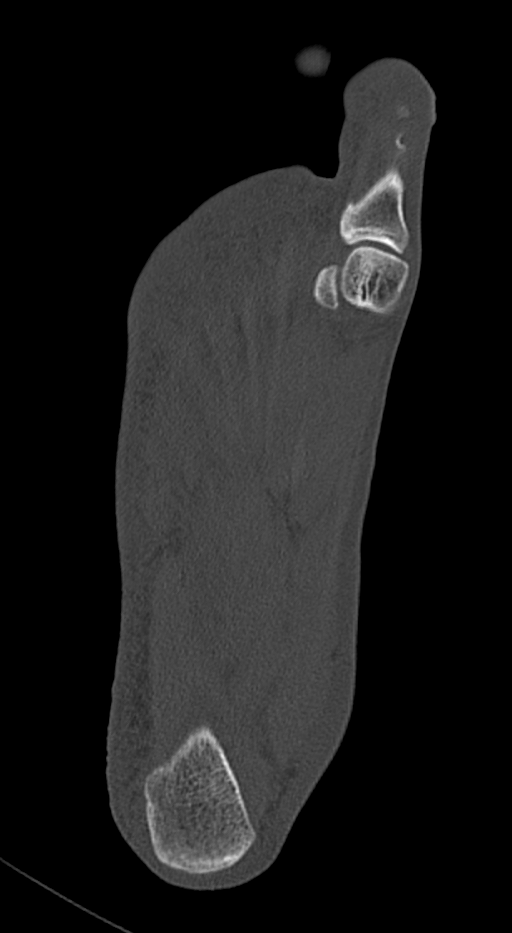
[im 117/153  bone]
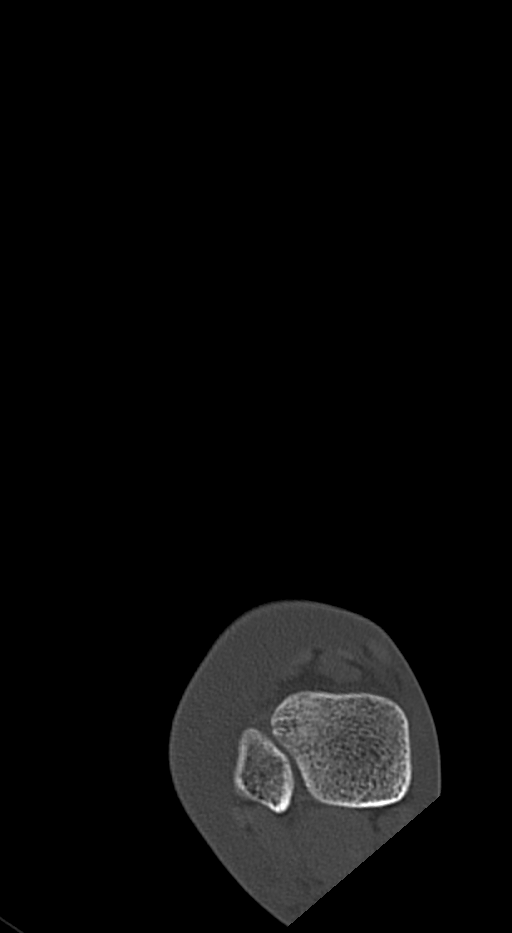

[Series 7: sag bone · sagittal · 0.32mm/px · 5 of 113 slices shown, 6 images]
[im 38/113  bone]
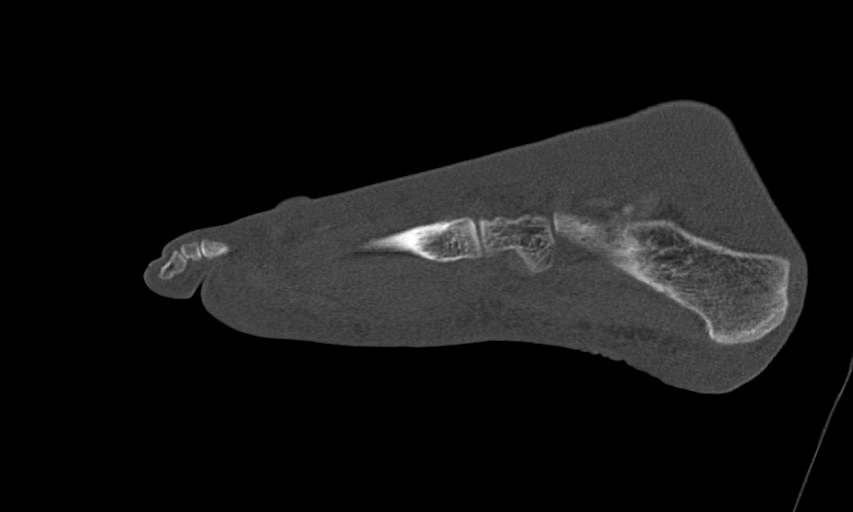
[im 47/113  bone]
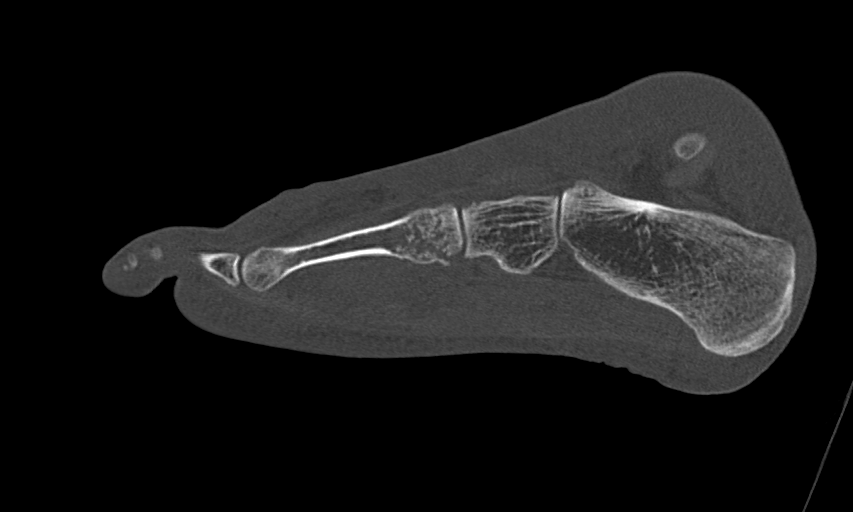
[im 57/113  soft-tissue]
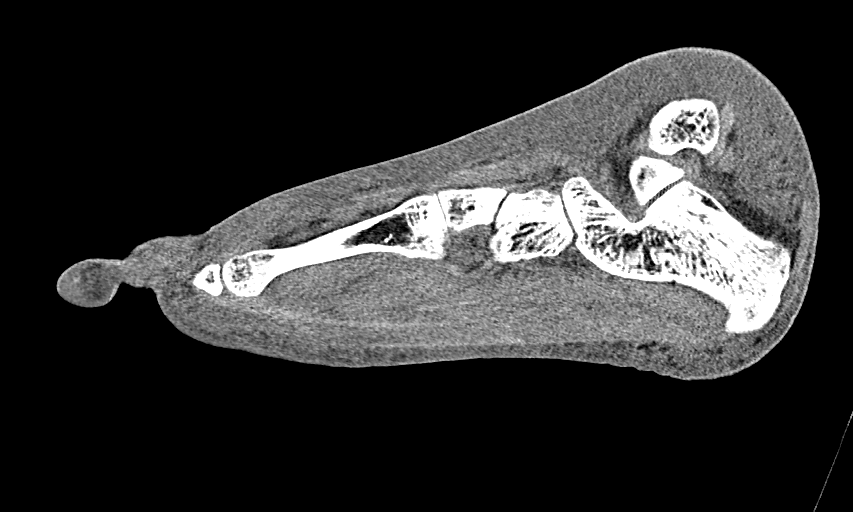
[im 57/113  bone]
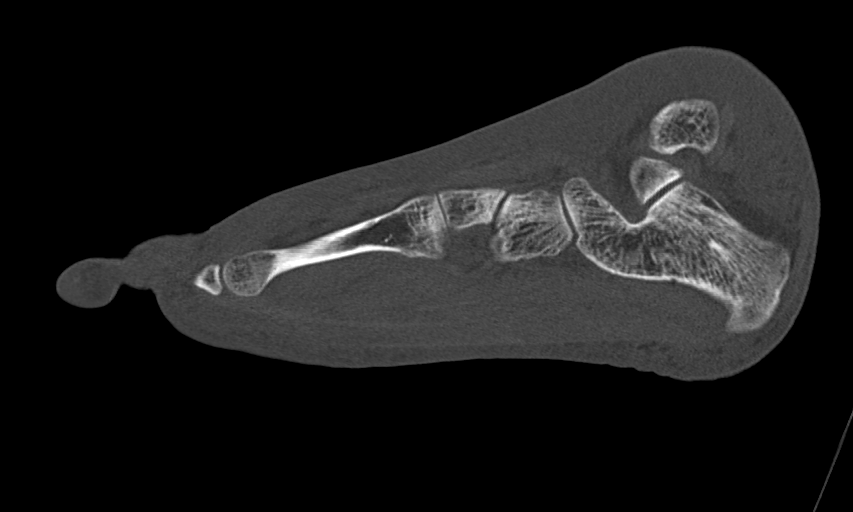
[im 66/113  bone]
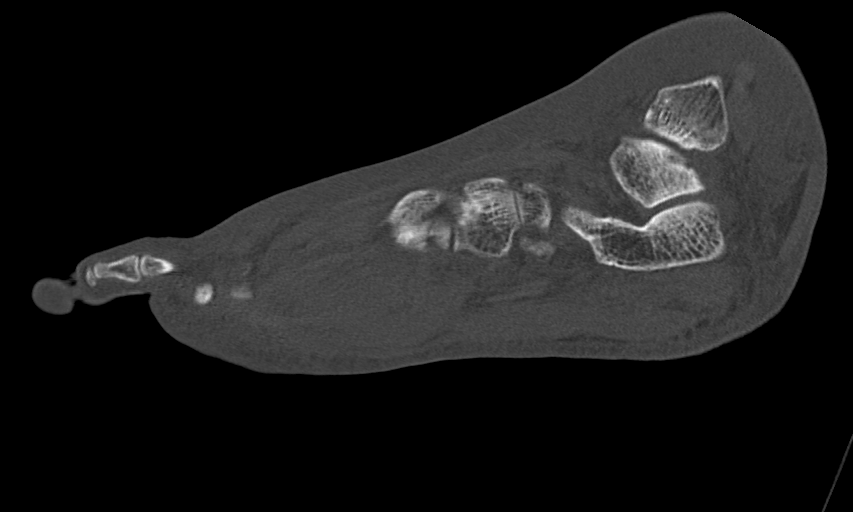
[im 75/113  bone]
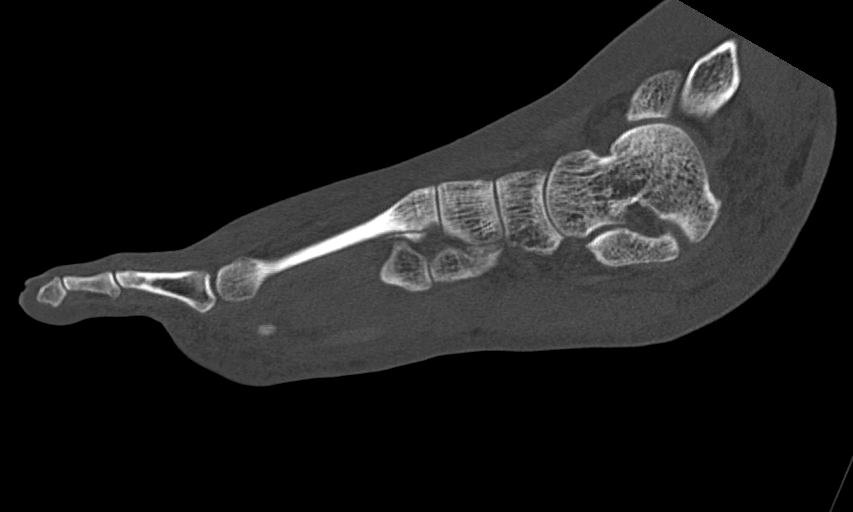

[10 of 34 positions shown; findings below may reference images not displayed]

FINDINGS: Bones/Joint/Cartilage

The distal tibia and fibula are unremarkable. The tibiotalar,
subtalar and midfoot articulations are congruent. No widening of the
Lisfranc joint.

The following fractures are identified on CT, radiographically
occult in appearance due to overlap:

1. Curvilinear avulsed fracture fragments off the dorsal lateral
aspect of the distal cuboid, series [DATE] and series [DATE] measuring
between 5 and 6 mm each.
2. Acute slightly comminuted fracture of the base of the fourth
metatarsal without intra-articular extension. 2 mm of medial
displacement of the distal main fracture fragment is identified,
series [DATE] and series [DATE]. Acute fracture at the base and proximal diaphysis of the second
metatarsal less significant displacement, series [DATE] and series
[DATE]. Transverse lucency of the medial first sesamoid of the first
metatarsal head. Given sclerotic margins, this may represent a
bipartite sesamoid versus stigmata of old remote sesamoid fracture.

Osteoarthritis of the first MTP.

Ligaments

Suboptimally assessed by CT.

Muscles and Tendons

No muscle atrophy or intramuscular hemorrhage.

Soft tissues

Moderate diffuse soft tissue edema of the included ankle and foot
more so laterally at the ankle and along the dorsum of the foot.
Mild soft tissue nodularity seen along the dorsum of the foot some
which may represent blisters.
IMPRESSION: 1. Comminuted fractures along the plantar proximal aspect of the
second and third metatarsal shafts with 2 mm of medial displacement
of the main fracture fragment involving the fourth metatarsal. The
base of the metatarsals however appear to maintain their alignment
at the MTP joints without abnormal widening of the Lisfranc ligament
space.
2. Small avulsion fracture involving the distal cuboid along its
dorsal lateral aspect.
3. Bipartite medial sesamoid versus stigmata remote sesamoid
fracture with transverse lucency noted but with sclerotic appearing
margins. If the patient has pain referable to this region, consider
a more recent fracture.

## 2020-02-03 ENCOUNTER — Encounter (HOSPITAL_COMMUNITY): Payer: Self-pay | Admitting: Emergency Medicine

## 2020-02-03 ENCOUNTER — Other Ambulatory Visit: Payer: Self-pay

## 2020-02-03 ENCOUNTER — Emergency Department (HOSPITAL_COMMUNITY)
Admission: EM | Admit: 2020-02-03 | Discharge: 2020-02-03 | Disposition: A | Discharge status: Home / Self Care | Payer: Self-pay | Attending: Emergency Medicine | Admitting: Emergency Medicine

## 2020-02-03 DIAGNOSIS — K4021 Bilateral inguinal hernia, without obstruction or gangrene, recurrent: Secondary | ICD-10-CM | POA: Insufficient documentation

## 2020-02-03 DIAGNOSIS — N454 Abscess of epididymis or testis: Secondary | ICD-10-CM | POA: Insufficient documentation

## 2020-02-03 DIAGNOSIS — B356 Tinea cruris: Secondary | ICD-10-CM | POA: Insufficient documentation

## 2020-02-03 DIAGNOSIS — F17210 Nicotine dependence, cigarettes, uncomplicated: Secondary | ICD-10-CM | POA: Insufficient documentation

## 2020-02-03 DIAGNOSIS — N492 Inflammatory disorders of scrotum: Secondary | ICD-10-CM

## 2020-02-03 MED ORDER — LIDOCAINE HCL (PF) 1 % IJ SOLN
5.0000 mL | Freq: Once | INTRAMUSCULAR | Status: AC
  Administered 2020-02-03: 5 mL via INTRADERMAL
  Filled 2020-02-03: qty 30

## 2020-02-03 MED ORDER — DOXYCYCLINE HYCLATE 100 MG PO CAPS: 100.0000 mg | ORAL_CAPSULE | Freq: Two times a day (BID) | ORAL | 0 refills | Status: AC

## 2020-02-03 MED ORDER — KETOCONAZOLE-HYDROCORTISONE 2-2.5 % EX CREA: 1.0000 "application " | TOPICAL_CREAM | Freq: Every day | CUTANEOUS | 1 refills | Status: AC

## 2020-02-03 MED ORDER — DOXYCYCLINE HYCLATE 100 MG PO CAPS: 100.0000 mg | ORAL_CAPSULE | Freq: Two times a day (BID) | ORAL | 0 refills | Status: DC

## 2020-02-03 MED ORDER — KETOCONAZOLE-HYDROCORTISONE 2-2.5 % EX CREA: 1.0000 "application " | TOPICAL_CREAM | Freq: Every day | CUTANEOUS | 1 refills | Status: DC

## 2020-02-03 NOTE — ED Notes (Signed)
Entered room and introduced self to patient. This RN notes that patient is resting in bed with no signs of acute distress noted. Respirations are even and unlabored with equal chest rise and fall. Bed is locked in the lowest position, side rails x1 at patient request. Educated on hourly rounding and call light use and verbalized understanding at this time.

## 2020-02-03 NOTE — Discharge Instructions (Signed)
There are couple different issues that we addressed today hears a quick summary:  Inguinal hernias: I am glad is not currently causing any trouble.  I have attached the contact information for Dr. Henreitta Leber, a general surgeon.  This to be an appropriate person for you to follow-up with to discuss hernia repair.  Scrotal abscess: We did try to incise and drain your abscess today without significant drainage.  I have prescribed you a 10-day course of antibiotics called doxycycline.  Please take this twice daily for the next 10 days.  I would like you to follow-up with urology with regard to this abscess to ensure it does not spread or cause any significant damage.  I think it will also be helpful for you to take daily baths to help keep that incision open so that the abscess can drain.  Please try to schedule an appointment with urology in the next week or so to follow-up and ensure that this abscess has improved.  Jock itch: I do think that this looks like a yeast infection.  I provided a combination antifungal and steroid medicine.  Apply this medicine once daily for at least the next 7 days.  I recommend applying it as long as your rash persists.  It may also be helpful to ask about this when you follow-up with urology.  Scrotal nodules: I do not think that these are anything concerning.  It may be helpful to ask urology about these as well.

## 2020-02-03 NOTE — ED Provider Notes (Signed)
[facility] EMERGENCY DEPARTMENT Provider Note   CSN: [id number] Arrival date & time: 02/03/20  1103     History Chief Complaint  Patient presents with  . Groin Pain    Dale Benjamin is a 49 y.o. male.  Dale Benjamin is a 49 year old man who presents to the ED with 1 month of groin itching and at least once a week of a scrotal infection.  He has a previous medical history significant for bilateral inguinal hernias.  He notes that he has had some scrotal itching for at least the past month.  He has been suspicious that he has been having jock itch and has tried multiple over-the-counter remedies including Lotrimin cream without complete resolution of his symptoms.  He is continued scratching vigorously and is increasingly frustrated with his situation.  He denies any penile lesions or penile discharge.  He does note that he has a growing scrotal and thigh wound for roughly the past week.  He believes that his thigh boil or abscess seems to have spontaneously drained and may be resolving although he is having increasing pain from his scrotal wound.  He denies trouble with urination, fever, nausea, vomiting.  Finally, he notes that he has some small bumps in the skin of his scrotum.  These bumps are no larger than half a centimeter and are not painful.  He is not particularly bothered by them except he would like to be reassured that they are not dangerous.        Past Medical History:  Diagnosis Date  . Back pain   . Seasonal allergies     Patient Active Problem List   Diagnosis Date Noted  . 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 SBO (small bowel obstruction) ([facility]) 04/13/2016  . Right inguinal hernia 04/13/2016    Past Surgical History:  Procedure Laterality Date  . HERNIA REPAIR Right 2008  . INGUINAL HERNIA REPAIR Right 04/15/2016   Procedure: HERNIA REPAIR INGUINAL INCARCERATED;  Surgeon: Franky Macho, MD;  Location: [facility];  Service:  General;  Laterality: Right;       Family History  Problem Relation Age of Onset  . Allergies Mother     Social History   Tobacco Use  . Smoking status: Current Every Day Smoker    Packs/day: 0.50    Types: Cigarettes  . Smokeless tobacco: Never Used  Vaping Use  . Vaping Use: Never used  Substance Use Topics  . Alcohol use: Yes    Alcohol/week: 3.0 standard drinks    Types: 3 Cans of beer per week    Comment: occ.  . Drug use: No    Home Medications Prior to Admission medications   Medication Sig Start Date End Date Taking? Authorizing Provider  doxycycline (VIBRAMYCIN) 100 MG capsule Take 1 capsule (100 mg total) by mouth 2 (two) times daily for 10 days. 02/03/20 02/13/20  Mirian Mo, MD  HYDROcodone-acetaminophen (NORCO/VICODIN) 5-325 MG tablet Take one tab po q 4 hrs prn pain 04/22/18   Triplett, Tammy, PA-C  ibuprofen (ADVIL) 600 MG tablet Take 1 tablet (600 mg total) by mouth 4 (four) times daily. 12/21/18   Ivery Quale, PA-C  Ketoconazole-Hydrocortisone 2-2.5 % CREA Apply 1 application topically daily. apply to lesion and 2 cm surrounding normal skin for at least 7 days 02/03/20   Mirian Mo, MD  mometasone (NASONEX) 50 MCG/ACT nasal spray Place 2 sprays into the nose daily. 04/04/18   Triplett, Tammy, PA-C  pseudoephedrine (  SUDAFED) 60 MG tablet Take 1 tablet (60 mg total) by mouth every 6 (six) hours as needed for congestion. 04/04/18   Triplett, Tammy, PA-C  traMADol (ULTRAM) 50 MG tablet Take 1 tablet (50 mg total) by mouth every 6 (six) hours as needed. 12/21/18   Ivery QualeBryant, [place], PA-C    Allergies    Patient has no known allergies.  Review of Systems   Review of Systems  Constitutional: Negative for chills and fever.  HENT: Negative for congestion and sore throat.   Respiratory: Negative for chest tightness and shortness of breath.   Cardiovascular: Negative for chest pain and palpitations.  Gastrointestinal: Negative for abdominal pain, nausea and  vomiting.  Genitourinary: Positive for scrotal swelling. Negative for discharge, dysuria, genital sores, penile pain, penile swelling and testicular pain.  Musculoskeletal: Negative for myalgias.  Skin: Positive for rash and wound.  Neurological: Negative for weakness.    Physical Exam Updated Vital Signs BP 125/82 (BP Location: Right Arm)   Pulse 71   Temp 98.3 F (36.8 C) (Oral)   Resp 18   SpO2 98%   Physical Exam Constitutional:      General: He is not in acute distress.    Appearance: Normal appearance. He is normal weight. He is not ill-appearing.  HENT:     Head: Normocephalic and atraumatic.     Right Ear: External ear normal.     Left Ear: External ear normal.     Nose: Nose normal.     Mouth/Throat:     Mouth: Mucous membranes are moist.  Eyes:     General:        Right eye: No discharge.        Left eye: No discharge.     Conjunctiva/sclera: Conjunctivae normal.     Pupils: Pupils are equal, round, and reactive to light.  Cardiovascular:     Rate and Rhythm: Normal rate and regular rhythm.  Pulmonary:     Effort: Pulmonary effort is normal. No respiratory distress.     Breath sounds: No wheezing.  Abdominal:     General: Abdomen is flat. Bowel sounds are normal.     Palpations: Abdomen is soft.  Genitourinary:    Penis: Normal.      Testes: Normal.     Comments: His exam is notable for a well-healing lesion on his right inner thigh.  The seem to line up with a scrotal lesion on his right lateral scrotum.  The scrotal lesion did not show any significant erythema although there was about a 1.0 cm x 1.0 cm area of induration and significant tenderness with a central pore.  His exam is also notable for erythema, dryness and excoriation of the bottom of the shaft of the penis in addition to along the scrotum and his perineum. Musculoskeletal:        General: Normal range of motion.     Cervical back: Normal range of motion and neck supple.  Skin:    General:  Skin is warm and dry.     Findings: Lesion present.  Neurological:     General: No focal deficit present.     Mental Status: He is alert.  Psychiatric:        Mood and Affect: Mood normal.     ED Results / Procedures / Treatments   Labs (all labs ordered are listed, but only abnormal results are displayed) Labs Reviewed - No data to display  EKG None  Radiology No results found.  Procedures .Marland KitchenIncision and Drainage  Date/Time: 02/03/2020 2:40 PM Performed by: Mirian Mo, MD Authorized by: Blane Ohara, MD   Consent:    Consent obtained:  Verbal   Consent given by:  Patient   Alternatives discussed:  No treatment Location:    Type:  Abscess   Location:  Anogenital   Anogenital location:  Scrotal space Procedure type:    Complexity:  Simple Procedure details:    Incision types:  Stab incision   Drainage amount:  Scant   Wound treatment:  Wound left open   Packing materials:  None Post-procedure details:    Patient tolerance of procedure:  Tolerated well, no immediate complications   (including critical care time)  Medications Ordered in ED Medications  lidocaine (PF) (XYLOCAINE) 1 % injection 5 mL (5 mLs Intradermal Given by Other 02/03/20 1215)    ED Course  I have reviewed the triage vital signs and the nursing notes.  Pertinent labs & imaging results that were available during my care of the patient were reviewed by me and considered in my medical decision making (see chart for details).    MDM Rules/Calculators/A&P                          Dale Benjamin is a 49 year old man who presents to the ED with 1 month of groin itching and at least once a week of a scrotal infection.  He has a previous medical history significant for bilateral inguinal hernias.  Dale Benjamin was found to have several issues which were addressed while he was here:  Scrotal abscess: Incision and drainage of his scrotal abscess was attempted during his time in the ED.  An  incision was made without significant drainage of purulent material.  He was sent home with a 10-day course of doxycycline and encouraged to take daily baths to allow the abscess to continue to drain.  The thigh abscess appeared to be recently spontaneously drained and well-healing.  He was also encouraged to follow-up with urology in the next 1-2 weeks for further assessment.  Scrotal nodules: Overall, benign in appearance.  Suspicious for scrotal seborrhea.  He was also encouraged to follow-up with urology.  No further work-up needed in the ED.  Tinea cruris: History and physical are suspicious for either tinea cruris versus scrotal eczema.  Based on his lack of history of eczema, more suspicious of tinea cruris.  He reports having used over-the-counter antifungals for over the past month.  He was discharged home with a combination ketoconazole-hydrocortisone cream.  Bilateral inguinal hernias: He was encouraged to follow-up with general surgery.  No evidence of incarceration or strangulation at this time.  Final Clinical Impression(s) / ED Diagnoses Final diagnoses:  Scrotal abscess  Tinea cruris  Bilateral recurrent inguinal hernia without obstruction or gangrene    Rx / DC Orders ED Discharge Orders         Ordered    doxycycline (VIBRAMYCIN) 100 MG capsule  2 times daily,   Status:  Discontinued        02/03/20 1320    Ketoconazole-Hydrocortisone 2-2.5 % CREA  Daily,   Status:  Discontinued        02/03/20 1320    doxycycline (VIBRAMYCIN) 100 MG capsule  2 times daily        02/03/20 1337    Ketoconazole-Hydrocortisone 2-2.5 % CREA  Daily        02/03/20 1337  Mirian Mo, MD 02/03/20 1447    Blane Ohara, MD 02/03/20 [phone]

## 2020-02-03 NOTE — ED Triage Notes (Signed)
Pt reports he thinks he has "jock itch" x 1 month, reports pain to groin from dry, cracked, itching skin; reports he has tried OTC creams and sprays with no relief

## 2020-02-23 ENCOUNTER — Emergency Department (HOSPITAL_COMMUNITY): Payer: Self-pay

## 2020-02-23 ENCOUNTER — Encounter (HOSPITAL_COMMUNITY): Payer: Self-pay | Admitting: *Deleted

## 2020-02-23 ENCOUNTER — Emergency Department (HOSPITAL_COMMUNITY)
Admission: EM | Admit: 2020-02-23 | Discharge: 2020-02-23 | Disposition: A | Discharge status: Home / Self Care | Payer: Self-pay | Attending: Emergency Medicine | Admitting: Emergency Medicine

## 2020-02-23 DIAGNOSIS — F17210 Nicotine dependence, cigarettes, uncomplicated: Secondary | ICD-10-CM | POA: Insufficient documentation

## 2020-02-23 DIAGNOSIS — R1110 Vomiting, unspecified: Secondary | ICD-10-CM | POA: Insufficient documentation

## 2020-02-23 DIAGNOSIS — R110 Nausea: Secondary | ICD-10-CM | POA: Insufficient documentation

## 2020-02-23 LAB — URINALYSIS, ROUTINE W REFLEX MICROSCOPIC
Bilirubin Urine: NEGATIVE
Glucose, UA: NEGATIVE mg/dL
Hgb urine dipstick: NEGATIVE
Ketones, ur: 80 mg/dL — AB
Leukocytes,Ua: NEGATIVE
Nitrite: NEGATIVE
Protein, ur: NEGATIVE mg/dL
Specific Gravity, Urine: 1.04 — ABNORMAL HIGH (ref 1.005–1.030)
pH: 5 (ref 5.0–8.0)

## 2020-02-23 LAB — CBC
HCT: 44.9 % (ref 39.0–52.0)
Hemoglobin: 14.9 g/dL (ref 13.0–17.0)
MCH: 32 pg (ref 26.0–34.0)
MCHC: 33.2 g/dL (ref 30.0–36.0)
MCV: 96.6 fL (ref 80.0–100.0)
Platelets: 225 10*3/uL (ref 150–400)
RBC: 4.65 MIL/uL (ref 4.22–5.81)
RDW: 14.2 % (ref 11.5–15.5)
WBC: 16.2 10*3/uL — ABNORMAL HIGH (ref 4.0–10.5)
nRBC: 0 % (ref 0.0–0.2)

## 2020-02-23 LAB — COMPREHENSIVE METABOLIC PANEL
ALT: 22 U/L (ref 0–44)
AST: 39 U/L (ref 15–41)
Albumin: 4.5 g/dL (ref 3.5–5.0)
Alkaline Phosphatase: 66 U/L (ref 38–126)
Anion gap: 10 (ref 5–15)
BUN: 20 mg/dL (ref 6–20)
CO2: 22 mmol/L (ref 22–32)
Calcium: 8.9 mg/dL (ref 8.9–10.3)
Chloride: 105 mmol/L (ref 98–111)
Creatinine, Ser: 0.86 mg/dL (ref 0.61–1.24)
GFR, Estimated: >60 mL/min (ref >60)
Glucose, Bld: 77 mg/dL (ref 70–99)
Potassium: 4.8 mmol/L (ref 3.5–5.1)
Sodium: 137 mmol/L (ref 135–145)
Total Bilirubin: 1 mg/dL (ref 0.3–1.2)
Total Protein: 7.3 g/dL (ref 6.5–8.1)

## 2020-02-23 LAB — LIPASE, BLOOD: Lipase: 30 U/L (ref 11–51)

## 2020-02-23 MED ORDER — IOHEXOL 300 MG/ML  SOLN
100.0000 mL | Freq: Once | INTRAMUSCULAR | Status: AC | PRN
  Administered 2020-02-23: 100 mL via INTRAVENOUS

## 2020-02-23 MED ORDER — SODIUM CHLORIDE 0.9 % IV BOLUS
1000.0000 mL | Freq: Once | INTRAVENOUS | Status: AC
  Administered 2020-02-23: 1000 mL via INTRAVENOUS

## 2020-02-23 MED ORDER — ONDANSETRON 4 MG PO TBDP: ORAL_TABLET | ORAL | 0 refills | Status: AC

## 2020-02-23 NOTE — ED Notes (Signed)
To CT

## 2020-02-23 NOTE — Discharge Instructions (Addendum)
Follow up with dr. Lovell Sheehan this week for your hernia.  Return if problems

## 2020-02-23 NOTE — ED Triage Notes (Signed)
Right lower quadrant pain with nausea 

## 2020-02-23 NOTE — ED Notes (Signed)
Dr Estell Harpin in to re-eval and discuss findings

## 2020-02-23 NOTE — ED Notes (Signed)
Spec to lab

## 2020-02-23 NOTE — ED Notes (Signed)
Hernia repair 3 years ago here  Today RLQ pain  N/V after drinking water   IV Spec to lab

## 2020-02-23 NOTE — ED Provider Notes (Signed)
[facility] EMERGENCY DEPARTMENT Provider Note   CSN: [id number] Arrival date & time: 02/23/20  1453     History Chief Complaint  Patient presents with  . Abdominal Pain    Dale Benjamin is a 49 y.o. male.  Patient has a history of inguinal hernia.  Patient complains of some discomfort there along with some vomiting.  The history is provided by the patient and medical records. No language interpreter was used.  Abdominal Pain Pain location:  RLQ Pain quality: aching   Pain radiates to:  Does not radiate Pain severity:  Mild Onset quality:  Sudden Timing:  Constant Progression:  Worsening Chronicity:  Recurrent Context: not alcohol use   Associated symptoms: vomiting   Associated symptoms: no chest pain, no cough, no diarrhea, no fatigue and no hematuria        Past Medical History:  Diagnosis Date  . Back pain   . Seasonal allergies     Patient Active Problem List   Diagnosis Date Noted  . 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 SBO (small bowel obstruction) (HCC) 04/13/2016  . Right inguinal hernia 04/13/2016    Past Surgical History:  Procedure Laterality Date  . HERNIA REPAIR Right 2008  . INGUINAL HERNIA REPAIR Right 04/15/2016   Procedure: HERNIA REPAIR INGUINAL INCARCERATED;  Surgeon: Franky Macho, MD;  Location: [facility];  Service: General;  Laterality: Right;       Family History  Problem Relation Age of Onset  . Allergies Mother     Social History   Tobacco Use  . Smoking status: Current Every Day Smoker    Packs/day: 0.50    Types: Cigarettes  . Smokeless tobacco: Never Used  Vaping Use  . Vaping Use: Never used  Substance Use Topics  . Alcohol use: Yes    Alcohol/week: 3.0 standard drinks    Types: 3 Cans of beer per week    Comment: occ.  . Drug use: No    Home Medications Prior to Admission medications   Medication Sig Start Date End Date Taking? Authorizing Provider    HYDROcodone-acetaminophen (NORCO/VICODIN) 5-325 MG tablet Take one tab po q 4 hrs prn pain 04/22/18   Triplett, Tammy, PA-C  ibuprofen (ADVIL) 600 MG tablet Take 1 tablet (600 mg total) by mouth 4 (four) times daily. 12/21/18   Ivery Quale, PA-C  Ketoconazole-Hydrocortisone 2-2.5 % CREA Apply 1 application topically daily. apply to lesion and 2 cm surrounding normal skin for at least 7 days 02/03/20   Mirian Mo, MD  mometasone (NASONEX) 50 MCG/ACT nasal spray Place 2 sprays into the nose daily. 04/04/18   Triplett, Tammy, PA-C  ondansetron (ZOFRAN ODT) 4 MG disintegrating tablet 4mg  ODT q4 hours prn nausea/vomit 02/23/20   13/7/21, MD  pseudoephedrine (SUDAFED) 60 MG tablet Take 1 tablet (60 mg total) by mouth every 6 (six) hours as needed for congestion. 04/04/18   Triplett, Tammy, PA-C  traMADol (ULTRAM) 50 MG tablet Take 1 tablet (50 mg total) by mouth every 6 (six) hours as needed. 12/21/18   02/20/19, PA-C    Allergies    Patient has no known allergies.  Review of Systems   Review of Systems  Constitutional: Negative for appetite change and fatigue.  HENT: Negative for congestion, ear discharge and sinus pressure.   Eyes: Negative for discharge.  Respiratory: Negative for cough.   Cardiovascular: Negative for chest pain.  Gastrointestinal: Positive for abdominal pain and vomiting. Negative for diarrhea.  Genitourinary: Negative for frequency and hematuria.  Musculoskeletal: Negative for back pain.  Skin: Negative for rash.  Neurological: Negative for seizures and headaches.  Psychiatric/Behavioral: Negative for hallucinations.    Physical Exam Updated Vital Signs BP 113/64 (BP Location: Right Arm)   Pulse 70   Temp 98.8 F (37.1 C) (Oral)   Resp 16   SpO2 100%   Physical Exam Vitals and nursing note reviewed.  Constitutional:      Appearance: He is well-developed.  HENT:     Head: Normocephalic.  Eyes:     General: No scleral icterus.     Conjunctiva/sclera: Conjunctivae normal.  Neck:     Thyroid: No thyromegaly.  Cardiovascular:     Rate and Rhythm: Normal rate and regular rhythm.     Heart sounds: No murmur heard.  No friction rub. No gallop.   Pulmonary:     Breath sounds: No stridor. No wheezing or rales.  Chest:     Chest wall: No tenderness.  Abdominal:     General: There is no distension.     Tenderness: There is abdominal tenderness. There is no rebound.     Comments: Patient with a right inguinal hernia that was easily reduced  Musculoskeletal:        General: Normal range of motion.     Cervical back: Neck supple.  Lymphadenopathy:     Cervical: No cervical adenopathy.  Skin:    Findings: No erythema or rash.  Neurological:     Mental Status: He is alert and oriented to person, place, and time.     Motor: No abnormal muscle tone.     Coordination: Coordination normal.  Psychiatric:        Behavior: Behavior normal.     ED Results / Procedures / Treatments   Labs (all labs ordered are listed, but only abnormal results are displayed) Labs Reviewed  CBC - Abnormal; Notable for the following components:      Result Value   WBC 16.2 (*)    All other components within normal limits  URINALYSIS, ROUTINE W REFLEX MICROSCOPIC - Abnormal; Notable for the following components:   Specific Gravity, Urine 1.040 (*)    Ketones, ur 80 (*)    All other components within normal limits  LIPASE, BLOOD  COMPREHENSIVE METABOLIC PANEL    EKG None  Radiology CT ABDOMEN PELVIS W CONTRAST  Result Date: 02/23/2020 CLINICAL DATA:  Right lower quadrant pain, nausea and vomiting today EXAM: CT ABDOMEN AND PELVIS WITH CONTRAST TECHNIQUE: Multidetector CT imaging of the abdomen and pelvis was performed using the standard protocol following bolus administration of intravenous contrast. CONTRAST:  OMNIPAQUE IOHEXOL 300 MG/ML  SOLN COMPARISON:  04/13/2016 FINDINGS: Lower chest: No acute pleural or parenchymal lung  disease. Hepatobiliary: No focal liver abnormality is seen. No gallstones, gallbladder wall thickening, or biliary dilatation. Pancreas: Unremarkable. No pancreatic ductal dilatation or surrounding inflammatory changes. Spleen: Normal in size without focal abnormality. Adrenals/Urinary Tract: Adrenal glands are unremarkable. Kidneys are normal, without renal calculi, focal lesion, or hydronephrosis. Bladder is unremarkable. Stomach/Bowel: No bowel obstruction or ileus. Normal gas-filled appendix is seen in the right lower quadrant. Right inguinal hernia contains a portion of the distal small bowel, though hernia is much smaller than previous CT. No evidence of incarceration or strangulation. Vascular/Lymphatic: Aortic atherosclerosis. No enlarged abdominal or pelvic lymph nodes. Reproductive: Prostate is unremarkable. Other: No free fluid or free gas. Musculoskeletal: No acute or destructive bony lesions. Reconstructed images demonstrate no additional  findings. IMPRESSION: 1. Right inguinal hernia containing a small portion of distal small bowel. Hernia is decreased in size since prior study, with no evidence of obstruction or incarceration. 2. Normal appendix. 3.  Aortic Atherosclerosis (ICD10-I70.0). Electronically Signed   By: Sharlet Salina M.D.   On: 02/23/2020 17:56    Procedures Procedures (including critical care time)  Medications Ordered in ED Medications  sodium chloride 0.9 % bolus 1,000 mL (0 mLs Intravenous Stopped 02/23/20 1752)  iohexol (OMNIPAQUE) 300 MG/ML solution 100 mL (100 mLs Intravenous Contrast Given 02/23/20 1719)    ED Course  I have reviewed the triage vital signs and the nursing notes.  Pertinent labs & imaging results that were available during my care of the patient were reviewed by me and considered in my medical decision making (see chart for details).    MDM Rules/Calculators/A&P                          Patient with right inguinal hernia that has been reduced  there is no incarceration.  Patient sent home with Zofran and will follow up with general surgery Final Clinical Impression(s) / ED Diagnoses Final diagnoses:  Nausea    Rx / DC Orders ED Discharge Orders         Ordered    ondansetron (ZOFRAN ODT) 4 MG disintegrating tablet        02/23/20 1845           Bethann Berkshire, MD 02/23/20 1849

## 2020-03-22 ENCOUNTER — Other Ambulatory Visit: Payer: Self-pay

## 2020-03-22 ENCOUNTER — Emergency Department (HOSPITAL_COMMUNITY)
Admission: EM | Admit: 2020-03-22 | Discharge: 2020-03-22 | Disposition: A | Discharge status: Home / Self Care | Payer: Self-pay | Attending: Emergency Medicine | Admitting: Emergency Medicine

## 2020-03-22 ENCOUNTER — Encounter (HOSPITAL_COMMUNITY): Payer: Self-pay | Admitting: *Deleted

## 2020-03-22 DIAGNOSIS — S31109A Unspecified open wound of abdominal wall, unspecified quadrant without penetration into peritoneal cavity, initial encounter: Secondary | ICD-10-CM | POA: Insufficient documentation

## 2020-03-22 DIAGNOSIS — Y848 Other medical procedures as the cause of abnormal reaction of the patient, or of later complication, without mention of misadventure at the time of the procedure: Secondary | ICD-10-CM | POA: Insufficient documentation

## 2020-03-22 DIAGNOSIS — B356 Tinea cruris: Secondary | ICD-10-CM | POA: Insufficient documentation

## 2020-03-22 DIAGNOSIS — F17210 Nicotine dependence, cigarettes, uncomplicated: Secondary | ICD-10-CM | POA: Insufficient documentation

## 2020-03-22 HISTORY — DX: Unilateral inguinal hernia, without obstruction or gangrene, not specified as recurrent: K40.90

## 2020-03-22 MED ORDER — FLUCONAZOLE 200 MG PO TABS: 200.0000 mg | ORAL_TABLET | ORAL | 0 refills | Status: DC

## 2020-03-22 MED ORDER — SULFAMETHOXAZOLE-TRIMETHOPRIM 800-160 MG PO TABS
1.0000 | ORAL_TABLET | Freq: Once | ORAL | Status: AC
  Administered 2020-03-22: 1 via ORAL
  Filled 2020-03-22: qty 1

## 2020-03-22 MED ORDER — FLUCONAZOLE 150 MG PO TABS
150.0000 mg | ORAL_TABLET | Freq: Once | ORAL | Status: AC
  Administered 2020-03-22: 150 mg via ORAL
  Filled 2020-03-22: qty 1

## 2020-03-22 MED ORDER — SULFAMETHOXAZOLE-TRIMETHOPRIM 800-160 MG PO TABS: 1.0000 | ORAL_TABLET | Freq: Two times a day (BID) | ORAL | 0 refills | Status: AC

## 2020-03-22 NOTE — Discharge Instructions (Signed)
You were given the Diflucan here in the ED. Take 1 tablet once weekly for 2 additional weeks.  You we also written for Bactrim and antibiotics for your wound to your abdomen. Keep wound clean. If you notice surrounding redness, warmth, yellow, green drainage from wound seek re-evaluation

## 2020-03-22 NOTE — ED Triage Notes (Signed)
Pt c/o burning, itching, flaky skin to genitalia x 1.5 months. Pt reports he was seen here at [facility] recently and was given medication but it didn't help. Pt reports he is very uncomfortable and is concerned because it isn't getting any better.

## 2020-03-22 NOTE — ED Provider Notes (Signed)
[facility] EMER[facility]ENCY [facility]MENT [facility]   CSN: [id number] Arrival date & time: 03/22/20  [id number]     History Chief Complaint  Patient presents with   Groin Burn    Dale Benjamin is a 49 y.o. male with history significant for inguinal hernia, SBO who presents for evaluation of rash to genital region.  States this has been chronic x2 months.  Was given a cream here in the emergency department however patient states he did not have insurance and was not able to afford this.  He has been trying OTC jock itch cream which has not helped.  Patient states he has a wound to his left suprapubic region.  This has been present for 2 months as well.  Patient states this had started as an abscess, which is draining and has significantly improved since then.  Patient states he gets a scabbing over this area and then it rubs on his pants and the scab comes off.  He denies any surrounding redness, warmth or drainage.  No underlying tenderness.  Was seen here 1 month ago for scrotal pain and had CT scan which did not show any evidence of fluid collection.  Does have bilateral inguinal hernias which she states has not been giving him difficulties.  He denies fever, chills, nausea, vomiting, chest pain, shortness breath, abdominal pain, difficulty urinating, redness, swelling, warmth to his testicles or penis.  He denies purulent discharge from penis.  Not actively sexually active.  Is not been on any biotics recently.  Did have a scrotal abscess which she states has resolved as well.  Does have redness, pruritus to his perineal region.  He is concerned that this is not help with OTC medication.  He does not PCP follow-up.  He is not diabetic.  Denies additional aggravating or alleviating factors.  History obtained from patient and past medical records.  No interpreter used.  HPI     Past Medical History:  Diagnosis Date   Back pain    Inguinal hernia, right    Seasonal allergies     Patient  Active Problem List   Diagnosis Date Noted   Recurrent inguinal hernia of right side with obstruction 04/15/2016   SBO (small bowel obstruction) (HCC) 04/13/2016   Right inguinal hernia 04/13/2016    Past Surgical History:  Procedure Laterality Date   HERNIA REPAIR Right 2008   INGUINAL HERNIA REPAIR Right 04/15/2016   Procedure: HERNIA REPAIR INGUINAL INCARCERATED;  Surgeon: Franky MachoMark Jenkins, MD;  Location: [facility];  Service: General;  Laterality: Right;       Family History  Problem Relation Age of Onset   Allergies Mother     Social History   Tobacco Use   Smoking status: Current Every Day Smoker    Packs/day: 0.50    Types: Cigarettes   Smokeless tobacco: Never Used  Vaping Use   Vaping Use: Never used  Substance Use Topics   Alcohol use: Yes    Alcohol/week: 3.0 standard drinks    Types: 3 Cans of beer per week    Comment: occ.   Drug use: No    Home Medications Prior to Admission medications   Medication Sig Start Date End Date Taking? Authorizing Provider  fluconazole (DIFLUCAN) 200 MG tablet Take 1 tablet (200 mg total) by mouth every 3 (three) days. 03/22/20   Saydee Zolman A, PA-C  HYDROcodone-acetaminophen (NORCO/VICODIN) 5-325 MG tablet Take one tab po q 4 hrs prn pain 04/22/18   Triplett, Quinnammy, PA-C  ibuprofen (ADVIL) 600 MG tablet Take 1 tablet (600 mg total) by mouth 4 (four) times daily. 12/21/18   Ivery Quale, PA-C  Ketoconazole-Hydrocortisone 2-2.5 % CREA Apply 1 application topically daily. apply to lesion and 2 cm surrounding normal skin for at least 7 days 02/03/20   Mirian Mo, MD  mometasone (NASONEX) 50 MCG/ACT nasal spray Place 2 sprays into the nose daily. 04/04/18   Triplett, Tammy, PA-C  ondansetron (ZOFRAN ODT) 4 MG disintegrating tablet 4mg  ODT q4 hours prn nausea/vomit 02/23/20   13/7/21, MD  pseudoephedrine (SUDAFED) 60 MG tablet Take 1 tablet (60 mg total) by mouth every 6 (six) hours as needed for congestion. 04/04/18    Triplett, Tammy, PA-C  sulfamethoxazole-trimethoprim (BACTRIM DS) 800-160 MG tablet Take 1 tablet by mouth 2 (two) times daily for 7 days. 03/22/20 03/29/20  Nikolaj Geraghty A, PA-C  traMADol (ULTRAM) 50 MG tablet Take 1 tablet (50 mg total) by mouth every 6 (six) hours as needed. 12/21/18   02/20/19, PA-C    Allergies    Patient has no known allergies.  Review of Systems   Review of Systems  Constitutional: Negative.   HENT: Negative.   Respiratory: Negative.   Cardiovascular: Negative.   Gastrointestinal: Negative.   Genitourinary: Negative.   Musculoskeletal: Negative.   Skin: Positive for rash and wound.  Neurological: Negative.   All other systems reviewed and are negative.   Physical Exam Updated Vital Signs BP 105/75 (BP Location: Left Arm)    Pulse 82    Temp 97.9 F (36.6 C) (Oral)    Resp 16    Ht 5\' 8"  (1.727 m)    Wt 59 kg    SpO2 99%    BMI 19.77 kg/m   Physical Exam Vitals and nursing note reviewed. Exam conducted with a chaperone present.  Constitutional:      General: He is not in acute distress.    Appearance: He is well-developed. He is not ill-appearing, toxic-appearing or diaphoretic.  HENT:     Head: Normocephalic and atraumatic.     Nose: Nose normal.     Mouth/Throat:     Mouth: Mucous membranes are moist.  Eyes:     Pupils: Pupils are equal, round, and reactive to light.  Cardiovascular:     Rate and Rhythm: Normal rate and regular rhythm.     Pulses: Normal pulses.     Heart sounds: Normal heart sounds.  Pulmonary:     Effort: Pulmonary effort is normal. No respiratory distress.     Breath sounds: Normal breath sounds.  Abdominal:     General: Bowel sounds are normal. There is no distension.     Palpations: Abdomen is soft. There is no mass.     Tenderness: There is no abdominal tenderness. There is no right CVA tenderness, left CVA tenderness, guarding or rebound.     Hernia: No hernia is present.  Genitourinary:    Pubic Area: Rash  present.     Penis: Normal.      Testes: Normal. Cremasteric reflex is present.     Epididymis:     Right: Normal.     Left: Normal.       Comments: Nurse present in room during exam.  Inguinal hernias bilaterally, reducible on exam.  No rashes, lesions, erythema, warmth, fluctuance or induration to penis or scrotum.  He does have erythematous, pruritic, dry rash to perineum.  No discharge, warmth.  No fluctuance or induration.  Rash consistent with tinea.  Musculoskeletal:        General: Normal range of motion.     Cervical back: Normal range of motion and neck supple.  Skin:    General: Skin is warm and dry.     Capillary Refill: Capillary refill takes less than 2 seconds.     Comments: 2 cm, round, ulcerated lesion healing to left suprapubic region.  No active bleeding or drainage.  No surrounding erythema or warmth.  Has healing, white edges.  No fluctuance or induration.  No bulla, target lesion, desquamated skin, vesicles.  Neurological:     Mental Status: He is alert.     Comments: Cranial nerves II to XII grossly intact Intact sensation      ED Results / Procedures / Treatments   Labs (all labs ordered are listed, but only abnormal results are displayed) Labs Reviewed - No data to display  EKG None  Radiology No results found.  Procedures Procedures (including critical care time)  Medications Ordered in ED Medications  sulfamethoxazole-trimethoprim (BACTRIM DS) 800-160 MG per tablet 1 tablet (1 tablet Oral Given 03/22/20 1113)  fluconazole (DIFLUCAN) tablet 150 mg (150 mg Oral Given 03/22/20 1113)    ED Course  I have reviewed the triage vital signs and the nursing notes.  Pertinent labs & imaging results that were available during my care of the patient were reviewed by me and considered in my medical decision making (see chart for details).  49 year old presents for evaluation of rash that has been constant x2 months.  Was seen in the emergency department 2  months ago and DC home with topical cream for tinea as well as antibiotics for scrotal abscess.  Scrotal abscess resolved.  He unfortunately did not pick up the medications as he cannot afford these.  He was seen 1 month ago for nausea, vomiting and testicular pain patient felt was likely due to his inguinal hernia.  Had CT scan which did not show any evidence of incarceration, strangulation however specifically did not show evidence of fluid collection, foreign years gangrene.  On arrival today patient does not appear septic or ill.  He is afebrile.  Patient is not diabetic.  Not immunocompromise.  Does have erythematous, pruritic rash with excoriations to his perineal region.  No obvious abscess.  Area consistent with tinea.  Patient also has ulcerated lesion to his left suprapubic region.  Patient states this as well has been present x3 months.  Patient actually states this is healing.  Scab will occasionally flake off as this rubs on his pants.  Patient states this area has significantly improved.  No surrounding erythema, warmth, fluctuance, induration, bleeding or drainage.  See picture in chart.  Will give p.o. medications for tinea.  Also have looks of a low cost antibiotic.  Scab unroofed here in ED, wound care applied.  Wound does not look actively infected.  Wounds actually appear to be granulizing and appear to be healing.  I have low suspicion for underlying deep space infection  Patient denies any difficulty breathing or swallowing.  Pt has a patent airway without stridor and is handling secretions without difficulty; no angioedema. No blisters, no pustules, no warmth, no draining sinus tracts, no superficial abscesses, no bullous impetigo, no vesicles, no desquamation, no target lesions with dusky purpura or a central bulla. Not tender to touch. No concern for superimposed infection. No concern for SJS, TEN, TSS, tick borne illness, syphilis, fornier's gangrene, abscess or other life-threatening  condition.   The patient has  been appropriately medically screened and/or stabilized in the ED. I have low suspicion for any other emergent medical condition which would require further screening, evaluation or treatment in the ED or require inpatient management.  Patient is hemodynamically stable and in no acute distress.  Patient able to ambulate in department prior to ED.  Evaluation does not show acute pathology that would require ongoing or additional emergent interventions while in the emergency department or further inpatient treatment.  I have discussed the diagnosis with the patient and answered all questions.  Pain is been managed while in the emergency department and patient has no further complaints prior to discharge.  Patient is comfortable with plan discussed in room and is stable for discharge at this time.  I have discussed strict return precautions for returning to the emergency department.  Patient was encouraged to follow-up with PCP/specialist refer to at discharge.    MDM Rules/Calculators/A&P                           Final Clinical Impression(s) / ED Diagnoses Final diagnoses:  Tinea cruris  Wound of abdomen    Rx / DC Orders ED Discharge Orders         Ordered    sulfamethoxazole-trimethoprim (BACTRIM DS) 800-160 MG tablet  2 times daily        03/22/20 1120    fluconazole (DIFLUCAN) 200 MG tablet  every 72 hours        03/22/20 1120           Kayler Buckholtz A, PA-C 03/22/20 1327    Bethann Berkshire, MD 03/23/20 0840

## 2020-09-01 ENCOUNTER — Emergency Department (HOSPITAL_COMMUNITY)
Admission: EM | Admit: 2020-09-01 | Discharge: 2020-09-01 | Disposition: A | Discharge status: Home / Self Care | Payer: Commercial Managed Care - PPO | Attending: Emergency Medicine | Admitting: Emergency Medicine

## 2020-09-01 ENCOUNTER — Encounter (HOSPITAL_COMMUNITY): Payer: Self-pay | Admitting: *Deleted

## 2020-09-01 ENCOUNTER — Other Ambulatory Visit: Payer: Self-pay

## 2020-09-01 DIAGNOSIS — L02213 Cutaneous abscess of chest wall: Secondary | ICD-10-CM

## 2020-09-01 DIAGNOSIS — F17210 Nicotine dependence, cigarettes, uncomplicated: Secondary | ICD-10-CM | POA: Insufficient documentation

## 2020-09-01 DIAGNOSIS — L02211 Cutaneous abscess of abdominal wall: Secondary | ICD-10-CM | POA: Diagnosis not present

## 2020-09-01 LAB — BASIC METABOLIC PANEL
Anion gap: 7 (ref 5–15)
BUN: 9 mg/dL (ref 6–20)
CO2: 28 mmol/L (ref 22–32)
Calcium: 9.2 mg/dL (ref 8.9–10.3)
Chloride: 102 mmol/L (ref 98–111)
Creatinine, Ser: 0.68 mg/dL (ref 0.61–1.24)
GFR, Estimated: >60 mL/min (ref >60)
Glucose, Bld: 88 mg/dL (ref 70–99)
Potassium: 4 mmol/L (ref 3.5–5.1)
Sodium: 137 mmol/L (ref 135–145)

## 2020-09-01 LAB — CBC WITH DIFFERENTIAL/PLATELET
Abs Immature Granulocytes: 0.03 10*3/uL (ref 0.00–0.07)
Basophils Absolute: 0 10*3/uL (ref 0.0–0.1)
Basophils Relative: 0 %
Eosinophils Absolute: 0 10*3/uL (ref 0.0–0.5)
Eosinophils Relative: 0 %
HCT: 44.1 % (ref 39.0–52.0)
Hemoglobin: 14.4 g/dL (ref 13.0–17.0)
Immature Granulocytes: 0 %
Lymphocytes Relative: 9 %
Lymphs Abs: 1 10*3/uL (ref 0.7–4.0)
MCH: 32.1 pg (ref 26.0–34.0)
MCHC: 32.7 g/dL (ref 30.0–36.0)
MCV: 98.2 fL (ref 80.0–100.0)
Monocytes Absolute: 0.9 10*3/uL (ref 0.1–1.0)
Monocytes Relative: 8 %
Neutro Abs: 8.9 10*3/uL — ABNORMAL HIGH (ref 1.7–7.7)
Neutrophils Relative %: 83 %
Platelets: 236 10*3/uL (ref 150–400)
RBC: 4.49 MIL/uL (ref 4.22–5.81)
RDW: 13.7 % (ref 11.5–15.5)
WBC: 10.9 10*3/uL — ABNORMAL HIGH (ref 4.0–10.5)
nRBC: 0 % (ref 0.0–0.2)

## 2020-09-01 MED ORDER — LIDOCAINE HCL (PF) 1 % IJ SOLN
5.0000 mL | Freq: Once | INTRAMUSCULAR | Status: AC
  Administered 2020-09-01: 5 mL
  Filled 2020-09-01: qty 30

## 2020-09-01 MED ORDER — IBUPROFEN 800 MG PO TABS
800.0000 mg | ORAL_TABLET | Freq: Once | ORAL | Status: AC
  Administered 2020-09-01: 800 mg via ORAL
  Filled 2020-09-01: qty 1

## 2020-09-01 MED ORDER — CEPHALEXIN 500 MG PO CAPS: 500.0000 mg | ORAL_CAPSULE | Freq: Four times a day (QID) | ORAL | 0 refills | Status: AC

## 2020-09-01 MED ORDER — ACETAMINOPHEN 325 MG PO TABS
650.0000 mg | ORAL_TABLET | Freq: Once | ORAL | Status: AC
  Administered 2020-09-01: 650 mg via ORAL
  Filled 2020-09-01: qty 2

## 2020-09-01 NOTE — ED Triage Notes (Signed)
Boil in center chest

## 2020-09-01 NOTE — ED Notes (Signed)
Pt sitting up in bed, pt states that he is ready to go home, pt has dressing supplies.

## 2020-09-01 NOTE — ED Provider Notes (Signed)
Emergency Medicine Provider Triage Evaluation Note  Dale Benjamin , a 50 y.o. male  was evaluated in triage.  Pt complains of boil on chest, Jock itch, inguinal hernia.  Review of Systems  Positive: Swelling, redness and drainage from wound on chest, itchy rash on groin, inguinal hernia Negative: No fever, no cough, no dizziness, no weakness  Physical Exam  BP 124/81 (BP Location: Right Arm)   Pulse 85   Temp 99 F (37.2 C) (Oral)   Resp 17   Ht 5\' 8"  (1.727 m)   Wt 61.2 kg   SpO2 98%   BMI 20.53 kg/m  Gen:   Awake, no distress   Resp:  Normal effort  MSK:   Moves extremities without difficulty  Other:  Mass center chest over sternum, about 3 cm and raised about a centimeter half with surrounding erythema, centrally there appears to be a scab.  There is minimal fluctuance associated with this wound.  No associated crepitation.  Medical Decision Making  Medically screening exam initiated at 3:19 PM.  Appropriate orders placed.  RIGEL FILSINGER was informed that the remainder of the evaluation will be completed by another provider, this initial triage assessment does not replace that evaluation, and the importance of remaining in the ED until their evaluation is complete.  Apparent abscess on chest, but will likely require incision and drainage, will screen blood work to evaluate for diabetes metabolic disorders, will evaluate Foley after he is in examination room.   Carlene Coria, MD 09/01/20 1525

## 2020-09-01 NOTE — ED Notes (Signed)
Pt in bed, pt has a golf ball sized abscess in his center chest with redness surrounding, pt states that it has been there for the past 4 days.

## 2020-09-01 NOTE — ED Provider Notes (Addendum)
[facility] EMERGENCY DEPARTMENT Provider Note   CSN: [id number] Arrival date & time: 09/01/20  1401     History No chief complaint on file.   Dale Benjamin is a 50 y.o. male.  HPI Patient is a 50 year old male with a medical history as noted below.  He presents to the emergency department due to a point of pain and swelling to the central chest that started about 4 days ago.  Reports associated pain in the region.  He states initially he tried to apply pressure and drain the region which resulted in a small amount of purulent discharge before his symptoms started worsening.  Denies any fevers, chills, nausea, or vomiting.  He reports a history of abscesses to the axillary region but has never had an abscess to the chest.    Past Medical History:  Diagnosis Date  . Back pain   . Inguinal hernia, right   . Seasonal allergies     Patient Active Problem List   Diagnosis Date Noted  . 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 SBO (small bowel obstruction) (HCC) 04/13/2016  . Right inguinal hernia 04/13/2016    Past Surgical History:  Procedure Laterality Date  . HERNIA REPAIR Right 2008  . INGUINAL HERNIA REPAIR Right 04/15/2016   Procedure: HERNIA REPAIR INGUINAL INCARCERATED;  Surgeon: Franky Macho, MD;  Location: [facility];  Service: General;  Laterality: Right;       Family History  Problem Relation Age of Onset  . Allergies Mother     Social History   Tobacco Use  . Smoking status: Current Every Day Smoker    Packs/day: 0.50    Types: Cigarettes  . Smokeless tobacco: Never Used  Vaping Use  . Vaping Use: Never used  Substance Use Topics  . Alcohol use: Yes    Alcohol/week: 3.0 standard drinks    Types: 3 Cans of beer per week    Comment: occ.  . Drug use: No    Home Medications Prior to Admission medications   Medication Sig Start Date End Date Taking? Authorizing Provider  cephALEXin (KEFLEX) 500 MG capsule Take 1  capsule (500 mg total) by mouth 4 (four) times daily for 7 days. 09/01/20 09/08/20 Yes Placido Sou, PA-C  fluconazole (DIFLUCAN) 200 MG tablet Take 1 tablet (200 mg total) by mouth every 3 (three) days. 03/22/20   Henderly, Britni A, PA-C  HYDROcodone-acetaminophen (NORCO/VICODIN) 5-325 MG tablet Take one tab po q 4 hrs prn pain 04/22/18   Triplett, Tammy, PA-C  ibuprofen (ADVIL) 600 MG tablet Take 1 tablet (600 mg total) by mouth 4 (four) times daily. 12/21/18   Ivery Quale, PA-C  Ketoconazole-Hydrocortisone 2-2.5 % CREA Apply 1 application topically daily. apply to lesion and 2 cm surrounding normal skin for at least 7 days 02/03/20   Mirian Mo, MD  mometasone (NASONEX) 50 MCG/ACT nasal spray Place 2 sprays into the nose daily. 04/04/18   Triplett, Tammy, PA-C  ondansetron (ZOFRAN ODT) 4 MG disintegrating tablet 4mg  ODT q4 hours prn nausea/vomit 02/23/20   13/7/21, MD  pseudoephedrine (SUDAFED) 60 MG tablet Take 1 tablet (60 mg total) by mouth every 6 (six) hours as needed for congestion. 04/04/18   Triplett, Tammy, PA-C  traMADol (ULTRAM) 50 MG tablet Take 1 tablet (50 mg total) by mouth every 6 (six) hours as needed. 12/21/18   02/20/19, PA-C    Allergies    Patient has no known allergies.  Review of Systems  Review of Systems  All other systems reviewed and are negative. Ten systems reviewed and are negative for acute change, except as noted in the HPI.   Physical Exam Updated Vital Signs BP 124/81 (BP Location: Right Arm)   Pulse 85   Temp 99 F (37.2 C) (Oral)   Resp 17   Ht 5\' 8"  (1.727 m)   Wt 61.2 kg   SpO2 98%   BMI 20.53 kg/m   Physical Exam Vitals and nursing note reviewed.  Constitutional:      General: He is not in acute distress.    Appearance: He is well-developed.  HENT:     Head: Normocephalic and atraumatic.     Right Ear: External ear normal.     Left Ear: External ear normal.  Eyes:     General: No scleral icterus.       Right eye: No  discharge.        Left eye: No discharge.     Conjunctiva/sclera: Conjunctivae normal.  Neck:     Trachea: No tracheal deviation.  Cardiovascular:     Rate and Rhythm: Normal rate.  Pulmonary:     Effort: Pulmonary effort is normal. No respiratory distress.     Breath sounds: No stridor.  Abdominal:     General: There is no distension.  Musculoskeletal:        General: Tenderness present. No swelling or deformity.     Cervical back: Neck supple.  Skin:    General: Skin is warm and dry.     Findings: Abscess and erythema present. No rash.     Comments: 5 to 6 cm region of swelling and fluctuance noted to the sternal area of the chest.  Consistent with abscess.  Moderate tenderness overlying the site.  No cellulitis noted surrounding the region.  Neurological:     Mental Status: He is alert.     Cranial Nerves: Cranial nerve deficit: no gross deficits.     ED Results / Procedures / Treatments   Labs (all labs ordered are listed, but only abnormal results are displayed) Labs Reviewed  CBC WITH DIFFERENTIAL/PLATELET - Abnormal; Notable for the following components:      Result Value   WBC 10.9 (*)    Neutro Abs 8.9 (*)    All other components within normal limits  BASIC METABOLIC PANEL    EKG None  Radiology No results found.  Procedures . Incision and Drainage  Date/Time: 09/01/2020 5:26 PM Performed by: 09/03/2020, PA-C Authorized by: Placido Sou, PA-C   Consent:    Consent obtained:  Verbal   Consent given by:  Patient   Risks discussed:  Bleeding, incomplete drainage, pain and damage to other organs   Alternatives discussed:  No treatment Universal protocol:    Procedure explained and questions answered to patient or proxy's satisfaction: yes     Relevant documents present and verified: yes     Test results available : yes     Imaging studies available: yes     Required blood products, implants, devices, and special equipment available: yes      Site/side marked: yes     Immediately prior to procedure, a time out was called: yes     Patient identity confirmed:  Verbally with patient Location:    Type:  Abscess   Size:  5 cm   Location:  Trunk   Trunk location:  Chest Pre-procedure details:    Skin preparation:  Betadine Anesthesia:    Anesthesia  method:  Local infiltration   Local anesthetic:  Lidocaine 1% w/o epi Procedure type:    Complexity:  Complex Procedure details:    Incision types:  Single straight   Incision depth:  Subcutaneous   Wound management:  Probed and deloculated, irrigated with saline and extensive cleaning   Drainage:  Purulent   Drainage amount:  Moderate   Wound treatment:  Wound left open   Packing materials:  None Post-procedure details:    Procedure completion:  Tolerated well, no immediate complications    Medications Ordered in ED Medications  acetaminophen (TYLENOL) tablet 650 mg (has no administration in time range)  ibuprofen (ADVIL) tablet 800 mg (has no administration in time range)  lidocaine (PF) (XYLOCAINE) 1 % injection 5 mL (5 mLs Infiltration Given by Other 09/01/20 1621)    ED Course  I have reviewed the triage vital signs and the nursing notes.  Pertinent labs & imaging results that were available during my care of the patient were reviewed by me and considered in my medical decision making (see chart for details).    MDM Rules/Calculators/A&P                          Patient is a 50 year old male who presents to the emergency department with what appears to be an abscess to the chest.  He reports a history of abscesses to the axillary region but has never had an abscess on the chest wall.  I performed an I&D to the region.  Please see procedure note above.  Patient tolerated the procedure well.  Wound left open.  Given the region of the abscess as well as his mild leukocytosis, will discharge on a course of Keflex.  Recommended Tylenol and ibuprofen for management of his  pain.  Discussed return precautions in length and he knows to return to the ED if he develops any worsening pain, swelling, fevers, chills, nausea, or vomiting.  Feel that he is stable for discharge at this time and he is agreeable.  His questions were answered and he was amicable at the time of discharge.  Final Clinical Impression(s) / ED Diagnoses Final diagnoses:  Abscess of chest wall    Rx / DC Orders ED Discharge Orders         Ordered    cephALEXin (KEFLEX) 500 MG capsule  4 times daily        09/01/20 1729           Placido Sou, PA-C 09/01/20 1731    Placido Sou, PA-C 09/01/20 1734    Mancel Bale, MD 09/02/20 [phone]

## 2020-09-01 NOTE — Discharge Instructions (Signed)
I recommend a combination of tylenol and ibuprofen for management of your pain. You can take a low dose of both at the same time. I recommend 500 mg of Tylenol combined with 600 mg of ibuprofen. This is one maximum strength Tylenol and three regular ibuprofen. You can take these 2-3 times for day for your pain. Please try to take these medications with a small amount of food as well to prevent upsetting your stomach.  I am also prescribing you an antibiotic called Keflex.  Please take this 4 times a day for the next 7 days.  Do not stop taking this medication early even if you feel that your symptoms have resolved.  If you develop any new or worsening symptoms, please come back to the emergency department for reevaluation.  It was a pleasure to meet you.

## 2020-10-16 ENCOUNTER — Other Ambulatory Visit: Payer: Self-pay

## 2020-10-16 ENCOUNTER — Emergency Department (HOSPITAL_COMMUNITY)
Admission: EM | Admit: 2020-10-16 | Discharge: 2020-10-16 | Disposition: A | Discharge status: Home / Self Care | Payer: Commercial Managed Care - PPO | Attending: Emergency Medicine | Admitting: Emergency Medicine

## 2020-10-16 ENCOUNTER — Encounter (HOSPITAL_COMMUNITY): Payer: Self-pay

## 2020-10-16 DIAGNOSIS — B356 Tinea cruris: Secondary | ICD-10-CM | POA: Diagnosis not present

## 2020-10-16 DIAGNOSIS — L299 Pruritus, unspecified: Secondary | ICD-10-CM | POA: Diagnosis present

## 2020-10-16 DIAGNOSIS — F17210 Nicotine dependence, cigarettes, uncomplicated: Secondary | ICD-10-CM | POA: Diagnosis not present

## 2020-10-16 MED ORDER — FLUCONAZOLE 200 MG PO TABS: ORAL_TABLET | ORAL | 0 refills | Status: AC

## 2020-10-16 NOTE — ED Triage Notes (Signed)
Pt to er, pt states that he is here for some "jock itch" states that he has had it before, states that he has tried some otc but nothing seems to be helping, states that he walked a long way to get here and feels tired.

## 2020-10-16 NOTE — Discharge Instructions (Addendum)
As discussed, it is important that you keep the area clean and dry as possible.  Wear boxers not briefs.  Take the medication as directed.  You may also use over-the-counter antifungal powder or cream to the affected area.  Follow-up with your primary care provider for recheck.

## 2020-10-16 NOTE — ED Notes (Signed)
Pt keeps coming out of room requesting something to eat.  Crackers and water provided for pt.  Pt upset that crackers and water were offered but it's the only thing the dept has to offer pt.

## 2020-10-20 NOTE — ED Provider Notes (Signed)
[facility] EMERGENCY DEPARTMENT Provider Note   CSN: [id number] Arrival date & time: 10/16/20  1607     History Chief Complaint  Patient presents with   Rash    Dale Benjamin is a 50 y.o. male.   Rash Associated symptoms: no abdominal pain, no fever, no joint pain, no myalgias, no nausea, no shortness of breath and not vomiting        Dale Benjamin is a 50 y.o. male who presents to the Emergency Department complaining of itching, burning, and redness to his groin and scrotum.  Symptoms have been present for several days.  He states this is a recurring problem for him in the summer months.  He has tried multiple over-the-counter "jock itch" creams and powders without significant relief.  He denies any swelling of his scrotum, pain or discharge from his penis, urinary symptoms, or abdominal pain.  No history of diabetes.  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 04/15/2016   SBO (small bowel obstruction) (HCC) 04/13/2016   Right inguinal hernia 04/13/2016    Past Surgical History:  Procedure Laterality Date   HERNIA REPAIR Right 2008   INGUINAL HERNIA REPAIR Right 04/15/2016   Procedure: HERNIA REPAIR INGUINAL INCARCERATED;  Surgeon: Franky Macho, MD;  Location: [facility];  Service: General;  Laterality: Right;       Family History  Problem Relation Age of Onset   Allergies Mother     Social History   Tobacco Use   Smoking status: Every Day    Packs/day: 0.50    Pack years: 0.00    Types: Cigarettes   Smokeless tobacco: Never  Vaping Use   Vaping Use: Never used  Substance Use Topics   Alcohol use: Yes    Alcohol/week: 3.0 standard drinks    Types: 3 Cans of beer per week    Comment: occ.   Drug use: No    Home Medications Prior to Admission  medications   Medication Sig Start Date End Date Taking? Authorizing Provider  fluconazole (DIFLUCAN) 200 MG tablet Take 1 tablet every 3 days for 2 doses 10/16/20  Yes Taline Nass, PA-C  HYDROcodone-acetaminophen (NORCO/VICODIN) 5-325 MG tablet Take one tab po q 4 hrs prn pain 04/22/18   Floride Hutmacher, PA-C  ibuprofen (ADVIL) 600 MG tablet Take 1 tablet (600 mg total) by mouth 4 (four) times daily. 12/21/18   Ivery Quale, PA-C  Ketoconazole-Hydrocortisone 2-2.5 % CREA Apply 1 application topically daily. apply to lesion and 2 cm surrounding normal skin for at least 7 days 02/03/20   Mirian Mo, MD  mometasone (NASONEX) 50 MCG/ACT nasal spray Place 2 sprays into the nose daily. 04/04/18   Lyndall Bellot, PA-C  ondansetron (ZOFRAN ODT) 4 MG disintegrating tablet 4mg  ODT q4 hours prn nausea/vomit 02/23/20   13/7/21, MD  pseudoephedrine (SUDAFED) 60 MG tablet Take 1 tablet (60 mg total) by mouth every 6 (six) hours as needed for congestion. 04/04/18   Lalia Loudon, PA-C  traMADol (ULTRAM) 50 MG tablet Take 1 tablet (50 mg total) by mouth every 6 (six) hours as needed. 12/21/18   02/20/19, PA-C    Allergies    Patient has no known allergies.  Review of Systems   Review  of Systems  Constitutional:  Negative for chills and fever.  Respiratory:  Negative for shortness of breath.   Cardiovascular:  Negative for chest pain.  Gastrointestinal:  Negative for abdominal pain, nausea and vomiting.  Genitourinary:  Negative for decreased urine volume, dysuria, flank pain, genital sores, hematuria, penile discharge, penile swelling, scrotal swelling and testicular pain.  Musculoskeletal:  Negative for arthralgias, back pain and myalgias.  Skin:  Positive for rash.  Neurological:  Negative for dizziness, weakness and numbness.  Hematological:  Does not bruise/bleed easily.   Physical Exam Updated Vital Signs BP 129/81 (BP Location: Right Arm)   Pulse (!) 102   Temp 98.8 F (37.1 C)  (Oral)   Resp 12   Ht 5\' 8"  (1.727 m)   Wt 54.4 kg   SpO2 100%   BMI 18.25 kg/m   Physical Exam Vitals and nursing note reviewed.  Constitutional:      Appearance: Normal appearance. He is not ill-appearing or toxic-appearing.  HENT:     Head: Atraumatic.     Mouth/Throat:     Mouth: Mucous membranes are moist.     Pharynx: Oropharynx is clear.  Cardiovascular:     Rate and Rhythm: Normal rate and regular rhythm.     Pulses: Normal pulses.  Pulmonary:     Effort: Pulmonary effort is normal.     Breath sounds: Normal breath sounds.  Genitourinary:    Testes: Cremasteric reflex is present.        Right: Mass, tenderness or swelling not present.        Left: Mass, tenderness or swelling not present.     Comments: Genitalia examined by me with nursing chaperone present in the room.  Patient has confluent erythema of the groin and scrotum.  No vesicles, pustules or lesions noted. Musculoskeletal:        General: No tenderness. Normal range of motion.  Lymphadenopathy:     Lower Body: No right inguinal adenopathy. No left inguinal adenopathy.  Skin:    General: Skin is warm.     Capillary Refill: Capillary refill takes less than 2 seconds.  Neurological:     Mental Status: He is alert.     Sensory: No sensory deficit.     Motor: No weakness.    ED Results / Procedures / Treatments   Labs (all labs ordered are listed, but only abnormal results are displayed) Labs Reviewed - No data to display  EKG None  Radiology No results found.  Procedures Procedures   Medications Ordered in ED Medications - No data to display  ED Course  I have reviewed the triage vital signs and the nursing notes.  Pertinent labs & imaging results that were available during my care of the patient were reviewed by me and considered in my medical decision making (see chart for details).    MDM Rules/Calculators/A&P                          Patient here for evaluation of rash to his groin  and pubic area.  Rash appears consistent with tinea cruris No tenderness of the genitalia or lesions to suggest STI.  No dysuria symptoms or abdominal pain.  Patient well-appearing.  Patient slightly tachycardic, but stated that he walked to the emergency department in the heat.  Denies chest pain or shortness of breath.  Patient appears appropriate for discharge home, will treat with antifungal medication.  Patient agreeable to plan.  Final Clinical Impression(s) / ED Diagnoses Final diagnoses:  Tinea cruris    Rx / DC Orders ED Discharge Orders          Ordered    fluconazole (DIFLUCAN) 200 MG tablet        10/16/20 1908             Pauline Aus, PA-C 10/20/20 1332    Bethann Berkshire, MD 10/25/20 1702

## 2020-11-13 ENCOUNTER — Emergency Department (HOSPITAL_COMMUNITY)
Admission: EM | Admit: 2020-11-13 | Discharge: 2020-11-13 | Disposition: A | Discharge status: Home / Self Care | Payer: Commercial Managed Care - PPO | Attending: Emergency Medicine | Admitting: Emergency Medicine

## 2020-11-13 ENCOUNTER — Other Ambulatory Visit: Payer: Self-pay

## 2020-11-13 DIAGNOSIS — R21 Rash and other nonspecific skin eruption: Secondary | ICD-10-CM | POA: Diagnosis present

## 2020-11-13 DIAGNOSIS — F17210 Nicotine dependence, cigarettes, uncomplicated: Secondary | ICD-10-CM | POA: Diagnosis not present

## 2020-11-13 DIAGNOSIS — L723 Sebaceous cyst: Secondary | ICD-10-CM

## 2020-11-13 LAB — RAPID HIV SCREEN (HIV 1/2 AB+AG)
HIV 1/2 Antibodies: NONREACTIVE
HIV-1 P24 Antigen - HIV24: NONREACTIVE

## 2020-11-13 MED ORDER — CLOTRIMAZOLE-BETAMETHASONE 1-0.05 % EX CREA: TOPICAL_CREAM | CUTANEOUS | 0 refills | Status: AC

## 2020-11-13 NOTE — Discharge Instructions (Addendum)
Call Dr. Ronne Binning for further evaluation of your symptoms.  You may need an office procedure to surgically remove these sites.  In the interim you may try the Lotrisone cream prescribed simply for itch relief.  Your exam does not suggest jock itch but this medication may help relieve your symptoms.

## 2020-11-13 NOTE — ED Triage Notes (Signed)
Pt c/o jock itch. Pt requesting std check. Pt feels it is not getting better despite frequent ER visits. Pt states white bumps to genital region.

## 2020-11-14 NOTE — ED Provider Notes (Signed)
[facility] EMERGENCY DEPARTMENT Provider Note   CSN: [id number] Arrival date & time: 11/13/20  1614     History No chief complaint on file.   Dale Benjamin is a 50 y.o. male presenting for evaluation of groin itching and complaint of a rash on his scrotum which has not responded to multiple otc antifungal creams and medications prescribed here (last prescribed diflucan on 7/1) which he completed the course.  He denies any skin issues other places, no athletes foot.  Also denies penile discharge or pain but is desirous of std testing.  No known exposures.  Denies fevers, chills, abd pain, n/v, joint pain or swelling, sore throat.   The history is provided by the patient.      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 04/15/2016   SBO (small bowel obstruction) (HCC) 04/13/2016   Right inguinal hernia 04/13/2016    Past Surgical History:  Procedure Laterality Date   HERNIA REPAIR Right 2008   INGUINAL HERNIA REPAIR Right 04/15/2016   Procedure: HERNIA REPAIR INGUINAL INCARCERATED;  Surgeon: Franky Macho, MD;  Location: [facility];  Service: General;  Laterality: Right;       Family History  Problem Relation Age of Onset   Allergies Mother     Social History   Tobacco Use   Smoking status: Every Day    Packs/day: 0.50    Types: Cigarettes   Smokeless tobacco: Never  Vaping Use   Vaping Use: Never used  Substance Use Topics   Alcohol use: Yes    Alcohol/week: 3.0 standard drinks    Types: 3 Cans of beer per week    Comment: occ.   Drug use: No    Home Medications Prior to Admission medications   Medication Sig Start Date End Date Taking? Authorizing Provider  clotrimazole-betamethasone (LOTRISONE) cream Apply to affected area 2 times daily prn 11/13/20  Yes Zi Sek, Raynelle Fanning, PA-C  fluconazole  (DIFLUCAN) 200 MG tablet Take 1 tablet every 3 days for 2 doses 10/16/20   Triplett, Tammy, PA-C  HYDROcodone-acetaminophen (NORCO/VICODIN) 5-325 MG tablet Take one tab po q 4 hrs prn pain 04/22/18   Triplett, Tammy, PA-C  ibuprofen (ADVIL) 600 MG tablet Take 1 tablet (600 mg total) by mouth 4 (four) times daily. 12/21/18   Ivery Quale, PA-C  Ketoconazole-Hydrocortisone 2-2.5 % CREA Apply 1 application topically daily. apply to lesion and 2 cm surrounding normal skin for at least 7 days 02/03/20   Mirian Mo, MD  mometasone (NASONEX) 50 MCG/ACT nasal spray Place 2 sprays into the nose daily. 04/04/18   Triplett, Tammy, PA-C  ondansetron (ZOFRAN ODT) 4 MG disintegrating tablet 4mg  ODT q4 hours prn nausea/vomit 02/23/20   13/7/21, MD  pseudoephedrine (SUDAFED) 60 MG tablet Take 1 tablet (60 mg total) by mouth every 6 (six) hours as needed for congestion. 04/04/18   Triplett, Tammy, PA-C  traMADol (ULTRAM) 50 MG tablet Take 1 tablet (50 mg total) by mouth every 6 (six) hours as needed. 12/21/18   02/20/19, PA-C    Allergies    Patient has no known allergies.  Review of Systems   Review of Systems  Constitutional:  Negative for chills and fever.  HENT:  Negative for congestion and sore throat.   Eyes: Negative.  Respiratory:  Negative for chest tightness and shortness of breath.   Cardiovascular:  Negative for chest pain.  Gastrointestinal:  Negative for abdominal pain and nausea.  Genitourinary: Negative.  Negative for genital sores, scrotal swelling and testicular pain.       History of lesion/abscess left groin per prior chart documentation 12/21, resolved.  Musculoskeletal:  Negative for arthralgias, joint swelling and neck pain.  Skin:  Positive for rash. Negative for wound.  Neurological:  Negative for dizziness, weakness, light-headedness, numbness and headaches.  Psychiatric/Behavioral: Negative.     Physical Exam Updated Vital Signs BP 114/75   Pulse 98   Temp 98.6 F  (37 C) (Oral)   Resp 12   Ht 5\' 8"  (1.727 m)   Wt 54.4 kg   SpO2 100%   BMI 18.24 kg/m   Physical Exam Vitals and nursing note reviewed. Exam conducted with a chaperone present.  Constitutional:      Appearance: He is well-developed.  HENT:     Head: Normocephalic and atraumatic.  Eyes:     Conjunctiva/sclera: Conjunctivae normal.  Cardiovascular:     Rate and Rhythm: Normal rate and regular rhythm.     Heart sounds: Normal heart sounds.  Pulmonary:     Effort: Pulmonary effort is normal.     Breath sounds: Normal breath sounds. No wheezing.  Abdominal:     General: Bowel sounds are normal.     Palpations: Abdomen is soft.     Tenderness: There is no abdominal tenderness.  Genitourinary:    Penis: Circumcised. No erythema or lesions.      Comments: Multiple raised pearly colored lesions on skin of scrotum.  No surrounding erythema, no drainage, no obvious other rash present.   Musculoskeletal:        General: Normal range of motion.     Cervical back: Normal range of motion.  Skin:    General: Skin is warm and dry.  Neurological:     Mental Status: He is alert.    ED Results / Procedures / Treatments   Labs (all labs ordered are listed, but only abnormal results are displayed) Labs Reviewed  RAPID HIV SCREEN (HIV 1/2 AB+AG)  RPR  GC/CHLAMYDIA PROBE AMP ([place]) NOT AT [facility]    EKG None  Radiology No results found.  Procedures Procedures   Medications Ordered in ED Medications - No data to display  ED Course  I have reviewed the triage vital signs and the nursing notes.  Pertinent labs & imaging results that were available during my care of the patient were reviewed by me and considered in my medical decision making (see chart for details).    MDM Rules/Calculators/A&P                           Exam suggesting multiple small sebaceous cysts of scrotum. No drainage, no infected lesions.  No rash or other findings that would explain itching.  He  was offered Lotrisone cream for sx relief, but will need referral to urology for further management of his sx.  STD screening including rpr given complaint of intermittent rash and now healed lesion left groin.  Final Clinical Impression(s) / ED Diagnoses Final diagnoses:  Scrotal sebaceous cyst    Rx / DC Orders ED Discharge Orders          Ordered    clotrimazole-betamethasone (LOTRISONE) cream        11/13/20 2145  Burgess Amor, PA-C 11/14/20 1757    Pollyann Savoy, MD 11/14/20 [phone]

## 2020-11-15 LAB — RPR: RPR Ser Ql: NONREACTIVE

## 2020-11-16 LAB — GC/CHLAMYDIA PROBE AMP (~~LOC~~) NOT AT ARMC
Chlamydia: NEGATIVE
Comment: NEGATIVE
Comment: NORMAL
Neisseria Gonorrhea: NEGATIVE

## 2021-02-28 ENCOUNTER — Other Ambulatory Visit: Payer: Self-pay

## 2021-02-28 ENCOUNTER — Emergency Department (HOSPITAL_COMMUNITY)
Admission: EM | Admit: 2021-02-28 | Discharge: 2021-02-28 | Disposition: A | Discharge status: Home / Self Care | Payer: Commercial Managed Care - PPO | Attending: Emergency Medicine | Admitting: Emergency Medicine

## 2021-02-28 ENCOUNTER — Encounter (HOSPITAL_COMMUNITY): Payer: Self-pay | Admitting: Emergency Medicine

## 2021-02-28 DIAGNOSIS — F17210 Nicotine dependence, cigarettes, uncomplicated: Secondary | ICD-10-CM | POA: Diagnosis not present

## 2021-02-28 DIAGNOSIS — N5089 Other specified disorders of the male genital organs: Secondary | ICD-10-CM | POA: Diagnosis not present

## 2021-02-28 DIAGNOSIS — N509 Disorder of male genital organs, unspecified: Secondary | ICD-10-CM

## 2021-02-28 DIAGNOSIS — L989 Disorder of the skin and subcutaneous tissue, unspecified: Secondary | ICD-10-CM | POA: Diagnosis present

## 2021-02-28 NOTE — Discharge Instructions (Signed)
Call your primary care doctor or specialist as discussed in the next 2-3 days.   Return immediately back to the ER if:  Your symptoms worsen within the next 12-24 hours. You develop new symptoms such as new fevers, persistent vomiting, new pain, shortness of breath, or new weakness or numbness, or if you have any other concerns.  

## 2021-02-28 NOTE — ED Provider Notes (Signed)
[facility] EMERGENCY DEPARTMENT Provider Note   CSN: [id number] Arrival date & time: 02/28/21  1030     History Chief Complaint  Patient presents with   Rash    Dale Benjamin is a 50 y.o. male.  Patient presents with chief complaint of chronic scrotal lesion.  He has been having this for over a year.  He states that the intensity of these lesions in terms of the itchiness waxes and wanes.  He has been trying various antifungal creams and pills without improvement of symptoms and presents again here today.  Denies any fevers or cough or vomiting or diarrhea denies any history of diabetes or history of HIV.      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 04/15/2016   SBO (small bowel obstruction) (HCC) 04/13/2016   Right inguinal hernia 04/13/2016    Past Surgical History:  Procedure Laterality Date   HERNIA REPAIR Right 2008   INGUINAL HERNIA REPAIR Right 04/15/2016   Procedure: HERNIA REPAIR INGUINAL INCARCERATED;  Surgeon: Franky Macho, MD;  Location: [facility];  Service: General;  Laterality: Right;       Family History  Problem Relation Age of Onset   Allergies Mother     Social History   Tobacco Use   Smoking status: Every Day    Packs/day: 0.50    Types: Cigarettes   Smokeless tobacco: Never  Vaping Use   Vaping Use: Never used  Substance Use Topics   Alcohol use: Yes    Alcohol/week: 3.0 standard drinks    Types: 3 Cans of beer per week    Comment: occ.   Drug use: No    Home Medications Prior to Admission medications   Medication Sig Start Date End Date Taking? Authorizing Provider  clotrimazole-betamethasone (LOTRISONE) cream Apply to affected area 2 times daily prn 11/13/20   Idol, Raynelle Fanning, PA-C  fluconazole (DIFLUCAN) 200 MG tablet Take 1 tablet every 3 days for 2 doses  10/16/20   Triplett, Tammy, PA-C  HYDROcodone-acetaminophen (NORCO/VICODIN) 5-325 MG tablet Take one tab po q 4 hrs prn pain 04/22/18   Triplett, Tammy, PA-C  ibuprofen (ADVIL) 600 MG tablet Take 1 tablet (600 mg total) by mouth 4 (four) times daily. 12/21/18   Ivery Quale, PA-C  Ketoconazole-Hydrocortisone 2-2.5 % CREA Apply 1 application topically daily. apply to lesion and 2 cm surrounding normal skin for at least 7 days 02/03/20   Mirian Mo, MD  mometasone (NASONEX) 50 MCG/ACT nasal spray Place 2 sprays into the nose daily. 04/04/18   Triplett, Tammy, PA-C  ondansetron (ZOFRAN ODT) 4 MG disintegrating tablet 4mg  ODT q4 hours prn nausea/vomit 02/23/20   13/7/21, MD  pseudoephedrine (SUDAFED) 60 MG tablet Take 1 tablet (60 mg total) by mouth every 6 (six) hours as needed for congestion. 04/04/18   Triplett, Tammy, PA-C  traMADol (ULTRAM) 50 MG tablet Take 1 tablet (50 mg total) by mouth every 6 (six) hours as needed. 12/21/18   02/20/19, PA-C    Allergies    Patient has no known allergies.  Review of Systems   Review of Systems  Constitutional:  Negative for fever.  HENT:  Negative for ear pain and sore throat.   Eyes:  Negative for pain.  Respiratory:  Negative for cough.  Cardiovascular:  Negative for chest pain.  Gastrointestinal:  Negative for abdominal pain.  Genitourinary:  Negative for flank pain.  Musculoskeletal:  Negative for back pain.  Skin:  Negative for color change.  Neurological:  Negative for syncope.  All other systems reviewed and are negative.  Physical Exam Updated Vital Signs BP 120/78 (BP Location: Right Arm)   Pulse 81   Temp 98.2 F (36.8 C) (Oral)   Resp 16   Ht 5\' 8"  (1.727 m)   Wt 59 kg   SpO2 97%   BMI 19.77 kg/m   Physical Exam Constitutional:      Appearance: He is well-developed.  HENT:     Head: Normocephalic.     Nose: Nose normal.  Eyes:     Extraocular Movements: Extraocular movements intact.  Cardiovascular:     Rate  and Rhythm: Normal rate.  Pulmonary:     Effort: Pulmonary effort is normal.  Genitourinary:    Comments: Patient has 5-6 small 0.25 2.5 cm soft nodules in his scrotum.  These are nontender no erythema no overlying skin change or desquamation noted.  No weeping of the skin noted.  Relatively benign exam other than these nodules.  No scrotal tenderness noted.  No testicular pain or tenderness noted.  No penile discharge noted.  No ulcerative lesions noted. Skin:    Coloration: Skin is not jaundiced.  Neurological:     Mental Status: He is alert. Mental status is at baseline.    ED Results / Procedures / Treatments   Labs (all labs ordered are listed, but only abnormal results are displayed) Labs Reviewed - No data to display  EKG None  Radiology No results found.  Procedures Procedures   Medications Ordered in ED Medications - No data to display  ED Course  I have reviewed the triage vital signs and the nursing notes.  Pertinent labs & imaging results that were available during my care of the patient were reviewed by me and considered in my medical decision making (see chart for details).    MDM Rules/Calculators/A&P                           Given clinical picture today I am not convinced that these are fungal in nature.  Etiology is unclear.  These appear chronic given his history of having these symptoms for over 2 years now.  No evidence of active infection noted.  Advised outpatient follow-up with dermatology within the week.  Phone number provided for the patient to call make an appointment, advised return for fevers redness pain or any additional concerns.  Final Clinical Impression(s) / ED Diagnoses Final diagnoses:  Scrotal skin lesion    Rx / DC Orders ED Discharge Orders     None        , MD 02/28/21 1103

## 2021-02-28 NOTE — ED Triage Notes (Signed)
Pt c/o of rash/itching to his scrotum x 1 month. Pt has been using prescribed antifungal cream w/ no relief.

## 2021-06-01 IMAGING — CT CT ABD-PELV W/ CM
2 of 5 series · 16 of 46 positions shown, 18 images · IV contrast (Omnipaque or Isovue)
Comparison: 04/13/2016

CLINICAL DATA: Right lower quadrant pain, nausea and vomiting today

EXAM:
CT ABDOMEN AND PELVIS WITH CONTRAST
TECHNIQUE: Multidetector CT imaging of the abdomen and pelvis was performed
using the standard protocol following bolus administration of
intravenous contrast.
CONTRAST:  100mL OMNIPAQUE IOHEXOL 300 MG/ML  SOLN

[Series 2: axial st · axial · 0.69mm/px · z∈[+751,+1126]mm · 13 of 85 slices shown, 15 images]
[im 5/85  soft-tissue]
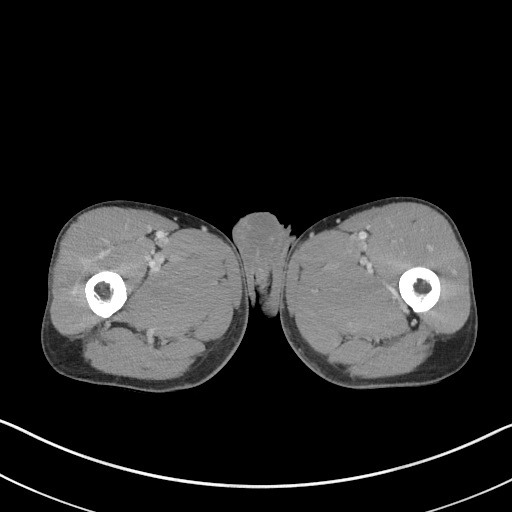
[im 5/85  bone]
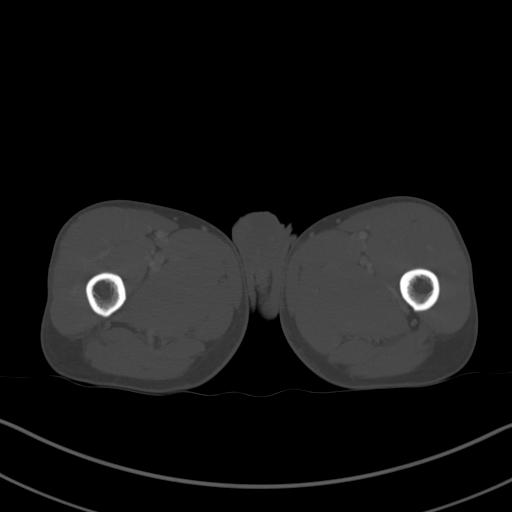
[im 10/85  soft-tissue]
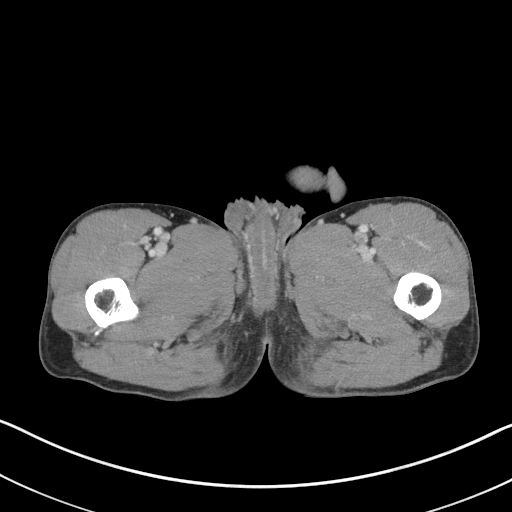
[im 19/85  soft-tissue]
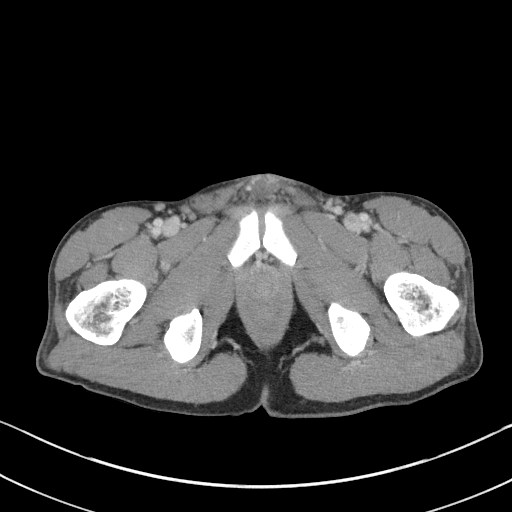
[im 24/85  soft-tissue]
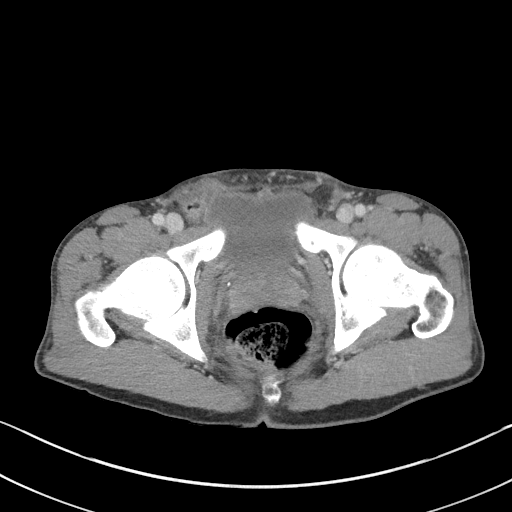
[im 29/85  soft-tissue]
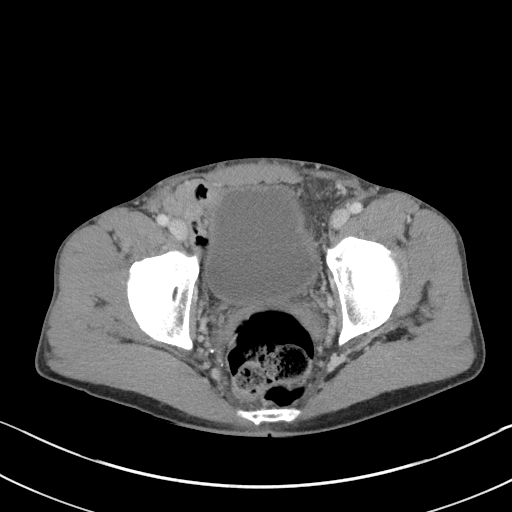
[im 38/85  soft-tissue]
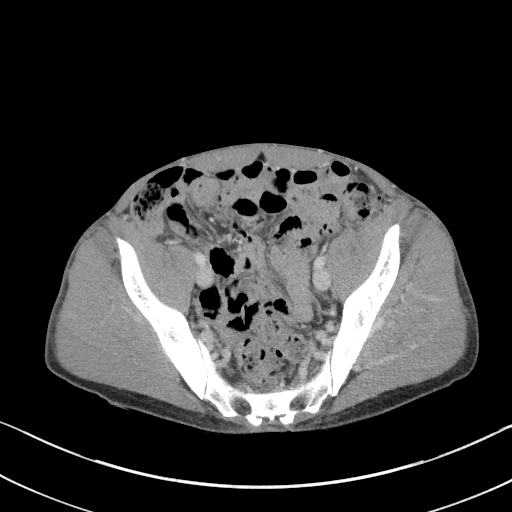
[im 43/85  soft-tissue]
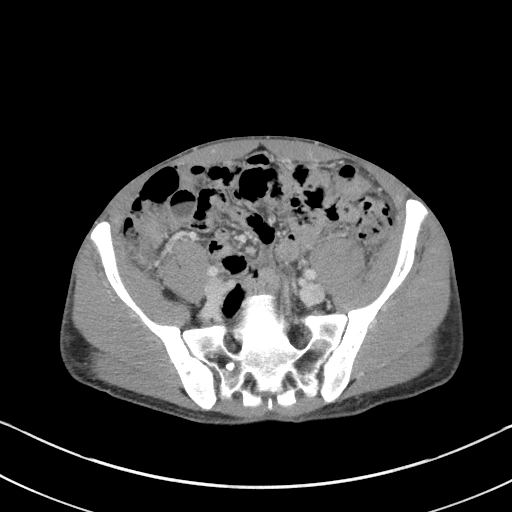
[im 47/85  soft-tissue]
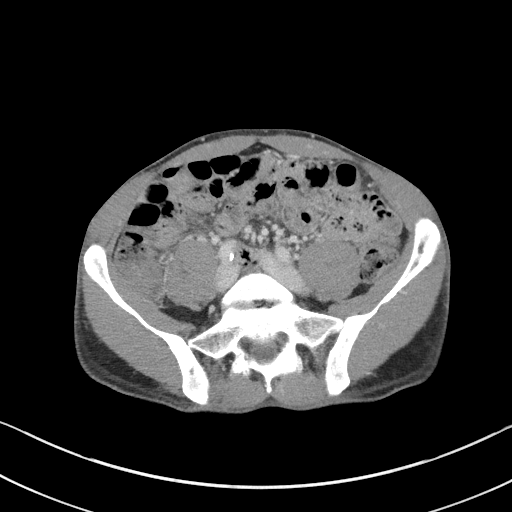
[im 57/85  soft-tissue]
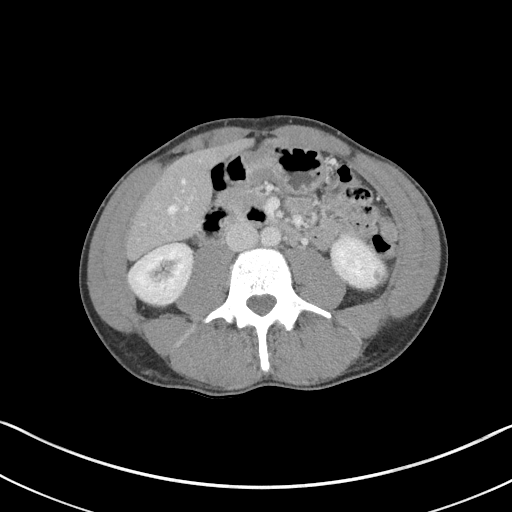
[im 57/85  bone]
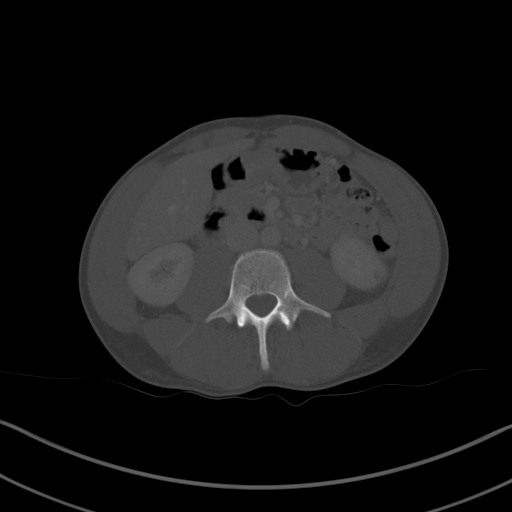
[im 61/85  soft-tissue]
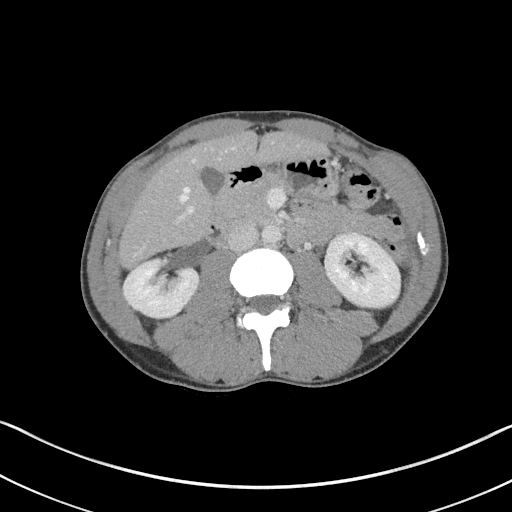
[im 66/85  soft-tissue]
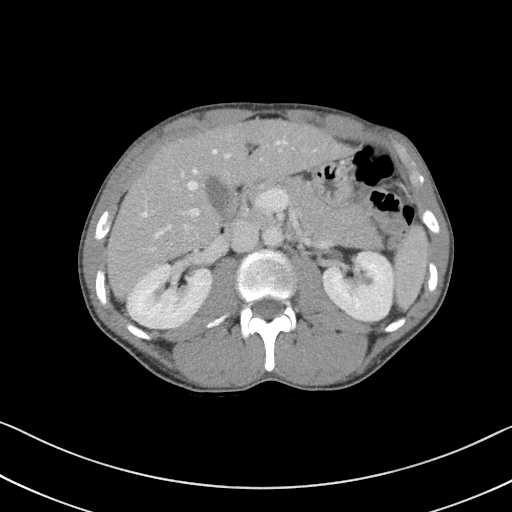
[im 75/85  soft-tissue]
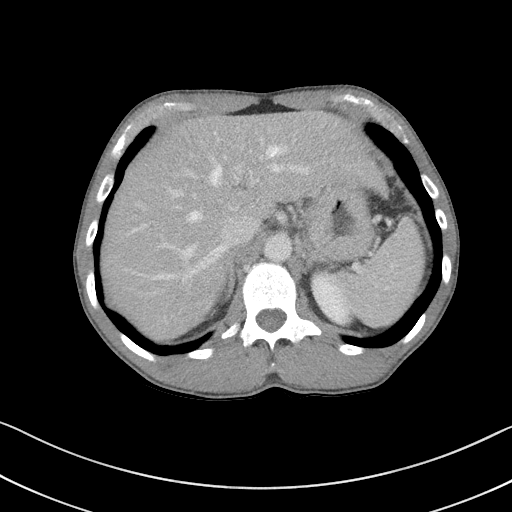
[im 80/85  soft-tissue]
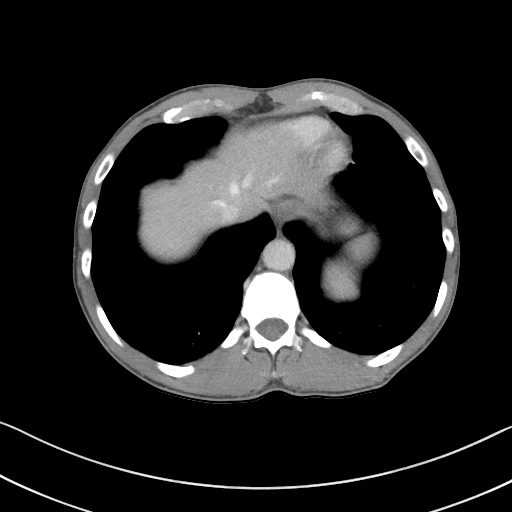

[Series 4: coronal st · coronal · 0.65mm/px · 3 of 84 slices shown]
[im 28/84  soft-tissue]
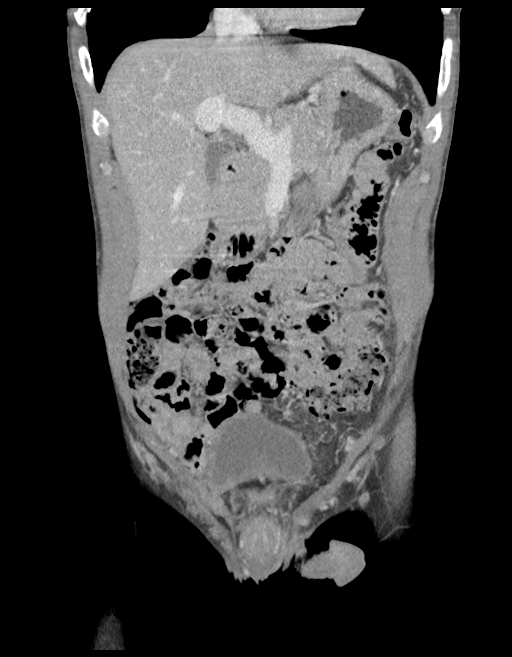
[im 37/84  soft-tissue]
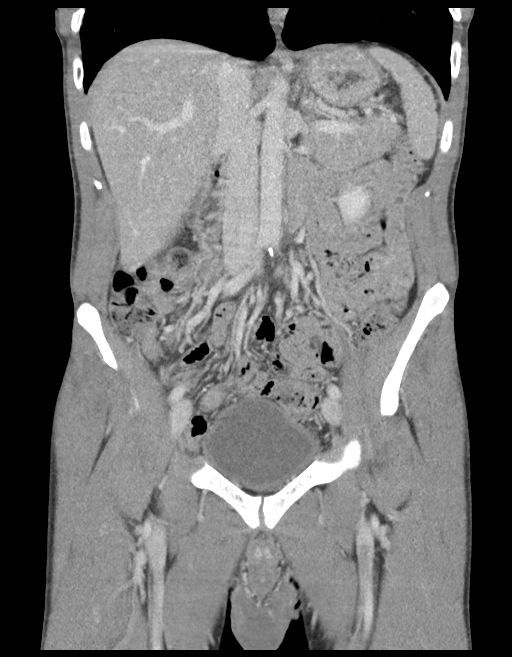
[im 47/84  soft-tissue]
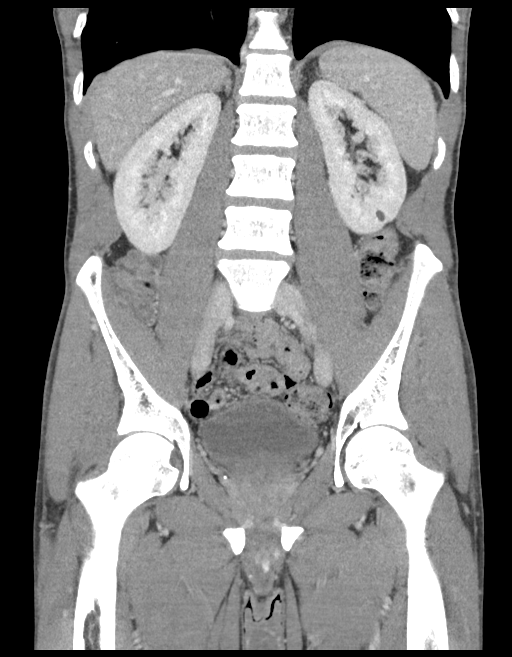

[16 of 46 positions shown; findings below may reference images not displayed]

FINDINGS: Lower chest: No acute pleural or parenchymal lung disease.

Hepatobiliary: No focal liver abnormality is seen. No gallstones,
gallbladder wall thickening, or biliary dilatation.

Pancreas: Unremarkable. No pancreatic ductal dilatation or
surrounding inflammatory changes.

Spleen: Normal in size without focal abnormality.

Adrenals/Urinary Tract: Adrenal glands are unremarkable. Kidneys are
normal, without renal calculi, focal lesion, or hydronephrosis.
Bladder is unremarkable.

Stomach/Bowel: No bowel obstruction or ileus. Normal gas-filled
appendix is seen in the right lower quadrant. Right inguinal hernia
contains a portion of the distal small bowel, though hernia is much
smaller than previous CT. No evidence of incarceration or
strangulation.

Vascular/Lymphatic: Aortic atherosclerosis. No enlarged abdominal or
pelvic lymph nodes.

Reproductive: Prostate is unremarkable.

Other: No free fluid or free gas.

Musculoskeletal: No acute or destructive bony lesions. Reconstructed
images demonstrate no additional findings.
IMPRESSION: 1. Right inguinal hernia containing a small portion of distal small
bowel. Hernia is decreased in size since prior study, with no
evidence of obstruction or incarceration.
2. Normal appendix.
3.  Aortic Atherosclerosis ([id number]).

## 2021-09-08 ENCOUNTER — Other Ambulatory Visit: Payer: Self-pay

## 2021-09-08 ENCOUNTER — Emergency Department (HOSPITAL_COMMUNITY)
Admission: EM | Admit: 2021-09-08 | Discharge: 2021-09-08 | Disposition: A | Discharge status: Home / Self Care | Payer: Commercial Managed Care - PPO | Attending: Emergency Medicine | Admitting: Emergency Medicine

## 2021-09-08 ENCOUNTER — Encounter (HOSPITAL_COMMUNITY): Payer: Self-pay | Admitting: Emergency Medicine

## 2021-09-08 DIAGNOSIS — L0291 Cutaneous abscess, unspecified: Secondary | ICD-10-CM

## 2021-09-08 DIAGNOSIS — L02211 Cutaneous abscess of abdominal wall: Secondary | ICD-10-CM | POA: Insufficient documentation

## 2021-09-08 MED ORDER — LIDOCAINE HCL (PF) 2 % IJ SOLN: 10.0000 mL | Freq: Once | INTRAMUSCULAR | Status: DC

## 2021-09-08 MED ORDER — POVIDONE-IODINE 10 % EX SOLN
CUTANEOUS | Status: DC | PRN
  Filled 2021-09-08: qty 14.8

## 2021-09-08 MED ORDER — HYDROCODONE-ACETAMINOPHEN 5-325 MG PO TABS: 2.0000 | ORAL_TABLET | ORAL | 0 refills | Status: AC | PRN

## 2021-09-08 MED ORDER — LIDOCAINE HCL (PF) 2 % IJ SOLN
INTRAMUSCULAR | Status: AC
  Filled 2021-09-08: qty 10

## 2021-09-08 MED ORDER — DOXYCYCLINE HYCLATE 100 MG PO TABS
100.0000 mg | ORAL_TABLET | Freq: Once | ORAL | Status: AC
  Administered 2021-09-08: 100 mg via ORAL
  Filled 2021-09-08: qty 1

## 2021-09-08 MED ORDER — DOXYCYCLINE HYCLATE 100 MG PO CAPS: 100.0000 mg | ORAL_CAPSULE | Freq: Two times a day (BID) | ORAL | 0 refills | Status: AC

## 2021-09-08 NOTE — ED Provider Notes (Signed)
[facility] EMERGENCY DEPARTMENT Provider Note   CSN: [id number] Arrival date & time: 09/08/21  1351     History  Chief Complaint  Patient presents with   Abscess    Dale Benjamin is a 51 y.o. male.   Abscess Associated symptoms: no fever, no nausea and no vomiting       Dale Benjamin is a 51 y.o. male who presents to the Emergency Department requesting evaluation of a boil to the skin of his abdomen.  He noticed a small "pimple" to the area that he attempted to squeeze several days ago.  Since that time, he noticed increasing swelling redness and pain.  He has had minimal yellow drainage from the area and a central scab.  He has pain associated with palpation and bending over.  He denies any fever, chills, nausea or vomiting.  Reports history of boils, he is not a diabetic.  Home Medications Prior to Admission medications   Medication Sig Start Date End Date Taking? Authorizing Provider  clotrimazole-betamethasone (LOTRISONE) cream Apply to affected area 2 times daily prn 11/13/20   Idol, Raynelle Fanning, PA-C  fluconazole (DIFLUCAN) 200 MG tablet Take 1 tablet every 3 days for 2 doses 10/16/20   Judit Awad, PA-C  HYDROcodone-acetaminophen (NORCO/VICODIN) 5-325 MG tablet Take one tab po q 4 hrs prn pain 04/22/18   [place] Jon, PA-C  ibuprofen (ADVIL) 600 MG tablet Take 1 tablet (600 mg total) by mouth 4 (four) times daily. 12/21/18   Ivery Quale, PA-C  Ketoconazole-Hydrocortisone 2-2.5 % CREA Apply 1 application topically daily. apply to lesion and 2 cm surrounding normal skin for at least 7 days 02/03/20   Mirian Mo, MD  mometasone (NASONEX) 50 MCG/ACT nasal spray Place 2 sprays into the nose daily. 04/04/18   Mikhaila Roh, PA-C  ondansetron (ZOFRAN ODT) 4 MG disintegrating tablet 4mg  ODT q4 hours prn nausea/vomit 02/23/20   13/7/21, MD  pseudoephedrine (SUDAFED) 60 MG tablet Take 1 tablet (60 mg total) by mouth every 6 (six) hours as needed for congestion. 04/04/18    Undra Harriman, PA-C  traMADol (ULTRAM) 50 MG tablet Take 1 tablet (50 mg total) by mouth every 6 (six) hours as needed. 12/21/18   02/20/19, PA-C      Allergies    Patient has no known allergies.    Review of Systems   Review of Systems  Constitutional:  Negative for chills and fever.  Respiratory:  Negative for shortness of breath.   Cardiovascular:  Negative for chest pain.  Gastrointestinal:  Negative for nausea and vomiting.  Musculoskeletal:  Negative for arthralgias and joint swelling.  Skin:  Positive for color change.       Abscess mid abdominal wall  Hematological:  Negative for adenopathy.   Physical Exam Updated Vital Signs BP 125/80 (BP Location: Right Arm)   Pulse 81   Temp 98.8 F (37.1 C) (Oral)   Resp 18   Ht 5\' 8"  (1.727 m)   Wt 54.4 kg   SpO2 95%   BMI 18.25 kg/m  Physical Exam Vitals and nursing note reviewed.  Constitutional:      General: He is not in acute distress.    Appearance: Normal appearance. He is not ill-appearing.  Cardiovascular:     Rate and Rhythm: Normal rate and regular rhythm.     Pulses: Normal pulses.  Pulmonary:     Effort: Pulmonary effort is normal.  Abdominal:     General: There is no distension.  Palpations: Abdomen is soft.     Tenderness: There is no abdominal tenderness.  Musculoskeletal:        General: Normal range of motion.  Skin:    General: Skin is warm.     Capillary Refill: Capillary refill takes less than 2 seconds.     Comments: 4 centimeter localized area of erythema to the mid anterior abdomen.  Significant induration without fluctuance.  Central eschar noted no active purulent drainage.  Tenderness is localized to the affected area.  No surrounding abdominal tenderness or guarding.  Neurological:     General: No focal deficit present.     Mental Status: He is alert.    ED Results / Procedures / Treatments   Labs (all labs ordered are listed, but only abnormal results are displayed) Labs  Reviewed - No data to display  EKG None  Radiology No results found.  Procedures Procedures    INCISION AND DRAINAGE Performed by: Ashtyn Meland Consent: Verbal consent obtained. Risks and benefits: risks, benefits and alternatives were discussed Type: abscess  Body area: Mid abdominal wall  Anesthesia: local infiltration  Incision was made with a #11 scalpel.  Local anesthetic: lidocaine 2% without epinephrine  Anesthetic total: 5 ml  Complexity: complex Blunt dissection to break up loculations  Drainage: purulent  Drainage amount: Moderate  Packing material: 1/4 in iodoform gauze  Patient tolerance: Patient tolerated the procedure well with no immediate complications.    Medications Ordered in ED Medications  lidocaine HCl (PF) (XYLOCAINE) 2 % injection 10 mL (has no administration in time range)  povidone-iodine (BETADINE) 10 % external solution (has no administration in time range)    ED Course/ Medical Decision Making/ A&P                           Medical Decision Making Risk OTC drugs. Prescription drug management.   Patient here for evaluation of abdominal wall abscess.  Patient is well-appearing nontoxic.  History of recurrent boils.  No history of diabetes and no reported fever or chills.  No nausea or vomiting.  On exam, he has a chronic appearing area of induration to the mid abdominal wall.  No fluctuance is noted and no active drainage. No significant cellulitis.  Area was successfully incised and drained.  Area irrigated with saline until no further purulent drainage noted.  Patient tolerated procedure well, will start with antibiotics, recommend warm soaks and packing removal in 2 to 3 days.  Patient agreeable to plan.  He appears appropriate for discharge home        Final Clinical Impression(s) / ED Diagnoses Final diagnoses:  Abscess    Rx / DC Orders ED Discharge Orders     None         Pauline Aus,  PA-C 09/08/21 1752    Vanetta Mulders, MD 09/24/21 1531

## 2021-09-08 NOTE — Discharge Instructions (Signed)
Warm water soaks or compresses to the affected area.  Keep the area bandaged.  The packing will need to be removed in 2 to 3 days.  Continue to take the antibiotics as directed until they are finished.  Return to emergency department for any new or worsening symptoms.

## 2021-09-08 NOTE — ED Triage Notes (Signed)
Pt has abscess to anterior abdomen, red in color, scabbed over, pt tried to lance himself unsuccessfully.

## 2022-04-18 DEATH — deceased
